# Patient Record
Sex: Male | Born: 1960
Health system: Southern US, Community
[De-identification: ages and names within clinical notes are randomized; demographics above are authoritative.]

## PROBLEM LIST (undated history)

## (undated) DIAGNOSIS — I499 Cardiac arrhythmia, unspecified: Secondary | ICD-10-CM

## (undated) DIAGNOSIS — M1990 Unspecified osteoarthritis, unspecified site: Secondary | ICD-10-CM

## (undated) DIAGNOSIS — IMO0001 Reserved for inherently not codable concepts without codable children: Secondary | ICD-10-CM

## (undated) DIAGNOSIS — Z87442 Personal history of urinary calculi: Secondary | ICD-10-CM

## (undated) DIAGNOSIS — K219 Gastro-esophageal reflux disease without esophagitis: Secondary | ICD-10-CM

## (undated) DIAGNOSIS — N200 Calculus of kidney: Secondary | ICD-10-CM

## (undated) DIAGNOSIS — C61 Malignant neoplasm of prostate: Secondary | ICD-10-CM

## (undated) HISTORY — PX: KIDNEY STONE SURGERY: SHX686

## (undated) HISTORY — PX: CERVICAL SPINE SURGERY: SHX589

## (undated) HISTORY — PX: OTHER SURGICAL HISTORY: SHX169

---

## 2005-12-15 ENCOUNTER — Emergency Department (HOSPITAL_COMMUNITY): Admission: EM | Admit: 2005-12-15 | Discharge: 2005-12-15 | Payer: Self-pay | Admitting: Emergency Medicine

## 2006-02-12 ENCOUNTER — Inpatient Hospital Stay (HOSPITAL_COMMUNITY): Admission: RE | Admit: 2006-02-12 | Discharge: 2006-02-13 | Payer: Self-pay | Admitting: Neurosurgery

## 2009-01-26 ENCOUNTER — Emergency Department (HOSPITAL_COMMUNITY): Admission: EM | Admit: 2009-01-26 | Discharge: 2009-01-26 | Payer: Self-pay | Admitting: Emergency Medicine

## 2009-01-27 ENCOUNTER — Inpatient Hospital Stay (HOSPITAL_COMMUNITY): Admission: EM | Admit: 2009-01-27 | Discharge: 2009-01-28 | Payer: Self-pay | Admitting: Emergency Medicine

## 2009-04-19 ENCOUNTER — Ambulatory Visit (HOSPITAL_COMMUNITY): Admission: RE | Admit: 2009-04-19 | Discharge: 2009-04-19 | Payer: Self-pay | Admitting: Urology

## 2009-05-10 ENCOUNTER — Emergency Department (HOSPITAL_COMMUNITY): Admission: EM | Admit: 2009-05-10 | Discharge: 2009-05-10 | Payer: Self-pay | Admitting: Emergency Medicine

## 2009-07-08 ENCOUNTER — Ambulatory Visit (HOSPITAL_COMMUNITY)
Admission: RE | Admit: 2009-07-08 | Discharge: 2009-07-08 | Payer: Self-pay | Source: Home / Self Care | Admitting: General Surgery

## 2010-05-10 ENCOUNTER — Encounter
Admission: RE | Admit: 2010-05-10 | Discharge: 2010-05-10 | Payer: Self-pay | Source: Home / Self Care | Attending: Internal Medicine | Admitting: Internal Medicine

## 2010-05-10 ENCOUNTER — Ambulatory Visit (HOSPITAL_COMMUNITY): Admission: RE | Admit: 2010-05-10 | Payer: Self-pay | Source: Home / Self Care | Admitting: Family Medicine

## 2010-06-11 ENCOUNTER — Encounter: Payer: Self-pay | Admitting: Family Medicine

## 2010-06-12 ENCOUNTER — Encounter: Payer: Self-pay | Admitting: Urology

## 2010-08-09 LAB — CBC
HCT: 40.8 % (ref 39.0–52.0)
Hemoglobin: 14 g/dL (ref 13.0–17.0)
MCHC: 34.3 g/dL (ref 30.0–36.0)
Platelets: 217 10*3/uL (ref 150–400)
RDW: 12.9 % (ref 11.5–15.5)
WBC: 8.5 10*3/uL (ref 4.0–10.5)

## 2010-08-09 LAB — BASIC METABOLIC PANEL
Calcium: 9.5 mg/dL (ref 8.4–10.5)
Creatinine, Ser: 0.95 mg/dL (ref 0.4–1.5)
GFR calc non Af Amer: >60 mL/min (ref >60)
Glucose, Bld: 86 mg/dL (ref 70–99)

## 2010-08-21 LAB — DIFFERENTIAL
Basophils Absolute: 0 10*3/uL (ref 0.0–0.1)
Lymphs Abs: 0.7 10*3/uL (ref 0.7–4.0)
Monocytes Absolute: 0.6 10*3/uL (ref 0.1–1.0)
Monocytes Relative: 5 % (ref 3–12)
Neutro Abs: 12.7 10*3/uL — ABNORMAL HIGH (ref 1.7–7.7)
Neutrophils Relative %: 91 % — ABNORMAL HIGH (ref 43–77)

## 2010-08-21 LAB — CBC
MCV: 91.4 fL (ref 78.0–100.0)
RBC: 4.44 MIL/uL (ref 4.22–5.81)
RDW: 13.5 % (ref 11.5–15.5)
WBC: 14 10*3/uL — ABNORMAL HIGH (ref 4.0–10.5)

## 2010-08-21 LAB — BASIC METABOLIC PANEL
BUN: 11 mg/dL (ref 6–23)
CO2: 29 mEq/L (ref 19–32)
Calcium: 9.4 mg/dL (ref 8.4–10.5)
Creatinine, Ser: 1.06 mg/dL (ref 0.4–1.5)
GFR calc non Af Amer: >60 mL/min (ref >60)

## 2010-08-21 LAB — URINALYSIS, ROUTINE W REFLEX MICROSCOPIC
Leukocytes, UA: NEGATIVE
Nitrite: NEGATIVE
Urobilinogen, UA: 0.2 mg/dL (ref 0.0–1.0)

## 2010-08-21 LAB — URINE MICROSCOPIC-ADD ON

## 2010-08-25 LAB — URINALYSIS, ROUTINE W REFLEX MICROSCOPIC
Protein, ur: NEGATIVE mg/dL
Specific Gravity, Urine: 1.02 (ref 1.005–1.030)
Urobilinogen, UA: 0.2 mg/dL (ref 0.0–1.0)
pH: 6 (ref 5.0–8.0)

## 2010-08-25 LAB — URINE CULTURE
Colony Count: NO GROWTH
Culture: NO GROWTH
Special Requests: NEGATIVE

## 2010-08-25 LAB — POCT I-STAT 4, (NA,K, GLUC, HGB,HCT)
Glucose, Bld: 121 mg/dL — ABNORMAL HIGH (ref 70–99)
HCT: 46 % (ref 39.0–52.0)

## 2010-10-06 NOTE — Op Note (Signed)
NAMETYRECE, VANTERPOOL NO.:  [id number]   MEDICAL RECORD NO.:  [id number]          PATIENT TYPE:  INP   LOCATION:  3014                         FACILITY:  [facility]   PHYSICIAN:  Hilda Lias, M.D.   DATE OF BIRTH:  10/16/1960   DATE OF PROCEDURE:  02/11/2006  DATE OF DISCHARGE:                                 OPERATIVE REPORT   PREOPERATIVE DIAGNOSES:  C5-C6, C6-C7 herniated disk with radiculopathy.  Displacement of spinal cord.   POSTOPERATIVE DIAGNOSES:  C5-C6, C6-C7 herniated disk with radiculopathy.  Displacement of spinal cord.   PROCEDURE:  Anterior C5-6, C6-7 decompression of the spinal cord.  Total  diskectomy.  Interbody fusion with allograft plate.  Microscope.   SURGEON:  Hilda Lias, M.D.   ASSISTANT:  Dr. Lovell Sheehan.   CLINICAL HISTORY:  The patient was seen in my office late last week because  of neck and left upper extremity pain.  Clinically I found weakness of the  biceps and the triceps.  He has a large herniated disk with displacement of  the spinal cord.  Surgery was advised, and the risks were explained in the  history and physical.   PROCEDURE:  The patient was taken to the OR, underwent intubation.  The neck  was prepped with DuraPrep.  A transverse incision was made into the skin,  subcutaneous tissue, platysma, down to the cervical spine.  X-ray showed  that we were at the level of 5-6.  From then on, anterior osteophytes were  removed.  We opened the anterior ligament and we brought the microscope in.  A large amount at the end of the disk was removed.  At the level of 5-6, we  found a herniated disk plus mostly spondylosis bilaterally, left worse than  right.  The posterior ligament was opened and total diskectomy with  decompression of the C6 nerve root and the spinal cord was achieved.  At the  level of C6-C7 we found the same finding except that in the left side there  were 2 large free fragments.  Having done this, the area was  irrigated.  The  end plates were drilled.  Then two pieces of allograft, one of 7 between 5-  6, another of 8 mm between 6 and 7 were introduced with autograft inside.  This was followed by a plate using 6 screws.  The lateral C-spine showed  good position of the graft and the plates.  From then on, the area was  irrigated.  Although we have hemostasis, we have some oozing during the  procedure and we left a Jackson-Pratt in the upper cervical area.  From then  on, the area was irrigated, closed with Vicryl and Steri-Strips.           ______________________________  Hilda Lias, M.D.     EB/MEDQ  D:  02/11/2006  T:  02/12/2006  Job:  [id number]

## 2010-10-30 ENCOUNTER — Other Ambulatory Visit: Payer: Self-pay | Admitting: Internal Medicine

## 2010-10-30 DIAGNOSIS — R911 Solitary pulmonary nodule: Secondary | ICD-10-CM

## 2010-10-31 ENCOUNTER — Other Ambulatory Visit: Payer: Self-pay

## 2010-10-31 ENCOUNTER — Ambulatory Visit
Admission: RE | Admit: 2010-10-31 | Discharge: 2010-10-31 | Disposition: A | Discharge status: Home / Self Care | Payer: BC Managed Care – PPO | Source: Ambulatory Visit | Attending: Internal Medicine | Admitting: Internal Medicine

## 2010-10-31 DIAGNOSIS — R911 Solitary pulmonary nodule: Secondary | ICD-10-CM

## 2010-10-31 MED ORDER — IOHEXOL 300 MG/ML  SOLN
75.0000 mL | Freq: Once | INTRAMUSCULAR | Status: AC | PRN
  Administered 2010-10-31: 75 mL via INTRAVENOUS

## 2011-08-17 ENCOUNTER — Encounter (HOSPITAL_COMMUNITY): Payer: Self-pay | Admitting: Emergency Medicine

## 2011-08-17 ENCOUNTER — Emergency Department (HOSPITAL_COMMUNITY)
Admission: EM | Admit: 2011-08-17 | Discharge: 2011-08-17 | Disposition: A | Discharge status: Home / Self Care | Payer: No Typology Code available for payment source | Attending: Emergency Medicine | Admitting: Emergency Medicine

## 2011-08-17 DIAGNOSIS — T148XXA Other injury of unspecified body region, initial encounter: Secondary | ICD-10-CM

## 2011-08-17 DIAGNOSIS — S40019A Contusion of unspecified shoulder, initial encounter: Secondary | ICD-10-CM | POA: Insufficient documentation

## 2011-08-17 DIAGNOSIS — IMO0001 Reserved for inherently not codable concepts without codable children: Secondary | ICD-10-CM | POA: Insufficient documentation

## 2011-08-17 DIAGNOSIS — M25519 Pain in unspecified shoulder: Secondary | ICD-10-CM | POA: Insufficient documentation

## 2011-08-17 HISTORY — DX: Reserved for inherently not codable concepts without codable children: IMO0001
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
## 2011-08-18 NOTE — ED Provider Notes (Signed)
Medical screening examination/treatment/procedure(s) were conducted as a shared visit with non-physician practitioner(s) and myself.  I personally evaluated the patient during the encounter  Doug Sou, MD 08/18/11 [phone]

## 2013-11-10 ENCOUNTER — Other Ambulatory Visit: Payer: Self-pay | Admitting: Internal Medicine

## 2013-11-10 DIAGNOSIS — M542 Cervicalgia: Secondary | ICD-10-CM

## 2013-11-11 ENCOUNTER — Ambulatory Visit
Admission: RE | Admit: 2013-11-11 | Discharge: 2013-11-11 | Disposition: A | Discharge status: Home / Self Care | Payer: BC Managed Care – PPO | Source: Ambulatory Visit | Attending: Internal Medicine | Admitting: Internal Medicine

## 2013-11-11 DIAGNOSIS — M542 Cervicalgia: Secondary | ICD-10-CM

## 2013-11-11 MED ORDER — IOHEXOL 300 MG/ML  SOLN
75.0000 mL | Freq: Once | INTRAMUSCULAR | Status: AC | PRN
  Administered 2013-11-11: 75 mL via INTRAVENOUS

## 2013-12-04 ENCOUNTER — Other Ambulatory Visit: Payer: Self-pay | Admitting: Neurosurgery

## 2013-12-04 DIAGNOSIS — R1310 Dysphagia, unspecified: Secondary | ICD-10-CM

## 2013-12-07 ENCOUNTER — Other Ambulatory Visit: Payer: BC Managed Care – PPO

## 2013-12-08 ENCOUNTER — Ambulatory Visit
Admission: RE | Admit: 2013-12-08 | Discharge: 2013-12-08 | Disposition: A | Discharge status: Home / Self Care | Payer: BC Managed Care – PPO | Source: Ambulatory Visit | Attending: Neurosurgery | Admitting: Neurosurgery

## 2013-12-08 DIAGNOSIS — R1310 Dysphagia, unspecified: Secondary | ICD-10-CM

## 2014-06-14 ENCOUNTER — Encounter: Payer: Self-pay | Admitting: Cardiology

## 2014-06-14 ENCOUNTER — Ambulatory Visit (INDEPENDENT_AMBULATORY_CARE_PROVIDER_SITE_OTHER): Payer: 59 | Admitting: Cardiology

## 2014-06-14 VITALS — BP 138/74 | HR 104 | Ht 72.0 in | Wt 195.0 lb

## 2014-06-14 DIAGNOSIS — R000 Tachycardia, unspecified: Secondary | ICD-10-CM | POA: Insufficient documentation

## 2014-06-14 DIAGNOSIS — R002 Palpitations: Secondary | ICD-10-CM | POA: Insufficient documentation

## 2014-06-14 DIAGNOSIS — I493 Ventricular premature depolarization: Secondary | ICD-10-CM | POA: Insufficient documentation

## 2014-06-14 MED ORDER — METOPROLOL SUCCINATE ER 25 MG PO TB24: 25.0000 mg | ORAL_TABLET | Freq: Every day | ORAL | Status: DC

## 2014-06-14 NOTE — Progress Notes (Signed)
      [place]. [address], [address] [place], [place]  [address] Phone: [phone] Fax:  [phone]  Date:  06/14/2014   ID:  Stanley Sharp, DOB 04/21/1961, MRN [id number]  PCP:  No primary care provider on file.   History of Present Illness: Stanley Sharp is a 54 y.o. male here for the evaluation of palpitations/tachycardia/PVCs. He was worked up previously in 2012 with normal echocardiogram. PVC's noted in workup.  Metoprolol worked well. May have tried diltiazem but this was changed. After being on metoprolol for approximately one year, did not want to take the medicine any further and we decided to taper off the medication. He did very well with this and has not had any palpitations up until very recently. 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    No ETOH, non smoker. No sudafed. No caffeine. He admits that he does not like to take medications.  His wife works at [employer].   Wt Readings from Last 3 Encounters:  06/14/14 195 lb (88.451 kg)     Past Medical History  Diagnosis Date  . No significant past medical history     Past Surgical History  Procedure Laterality Date  . Cervical spine surgery      No current outpatient prescriptions on file.   No current facility-administered medications for this visit.    Allergies:   No Known Allergies  Social History:  The patient  reports that he has never smoked. He does not have any smokeless tobacco history on file. He reports that he does not drink alcohol or use illicit drugs.   Family History: Grandfather had 3 MI's. Mother has DM.   ROS:  Please see the history of present illness.   No syncope, no bleeding, no chest pain, no orthopnea, no PND. Mildly anxious surrounding elevated heart rate.   All other systems reviewed and negative.   PHYSICAL EXAM: VS:  BP 138/74 mmHg  Pulse 104  Ht 6' (1.829 m)  Wt 195 lb (88.451 kg)  BMI 26.44 kg/m2 Well nourished, well developed, in no acute distress HEENT: normal,  [place]/AT, EOMI Neck: no JVD, normal carotid upstroke, no bruit. Left neck scar anterior noted. Prior cervical spine surgery. Cardiac:  normal S1, S2; Tachy mild with occasional ectopy; no murmur Lungs:  clear to auscultation bilaterally, no wheezing, rhonchi or rales Abd: soft, nontender, no hepatomegaly, no bruits Ext: no edema, 2+ distal pulses Skin: warm and dry GU: deferred Neuro: no focal abnormalities noted, AAO x 3  EKG:  06/14/14-sinus tachycardia with 2 PVCs noted. Nonspecific ST-T wave changes. Heart rate 104.    Echocardiogram: 2012-unremarkable. Normal ejection fraction.  ASSESSMENT AND PLAN:  1. Tachycardia/PVC-previously, responded well to metoprolol. We will go ahead and start metoprolol extended release 25 mg once a day. If after 5-7 days, no change in symptoms, we will likely increase the dosage to 50 mg. If cost is an issue, we will change to metoprolol tartrate. Previous workup was unremarkable. I will check TSH, basic metabolic profile. Check electrolytes. I do not appreciate any new murmurs. No heart failure symptoms. 2. We will see back in 2 months.  Signed, Candee Furbish, MD [facility]  06/14/2014 4:13 PM

## 2014-06-14 NOTE — Patient Instructions (Signed)
Please start Metoprolol succinate 25 mg once a day. Continue all other medications as listed.  Please have blood work today (BMP, TSH)  Follow up in 2 months with Dr Marlou Porch.  Thank you for choosing [facility]!!

## 2014-06-15 LAB — BASIC METABOLIC PANEL
BUN: 18 mg/dL (ref 6–23)
CALCIUM: 9.5 mg/dL (ref 8.4–10.5)
CHLORIDE: 106 meq/L (ref 96–112)
CO2: 25 meq/L (ref 19–32)
Creatinine, Ser: 0.9 mg/dL (ref 0.40–1.50)
GFR: 93 mL/min (ref >60.00)
Glucose, Bld: 92 mg/dL (ref 70–99)
Potassium: 3.8 mEq/L (ref 3.5–5.1)
SODIUM: 139 meq/L (ref 135–145)

## 2014-06-15 LAB — TSH: TSH: 1.51 u[IU]/mL (ref 0.35–4.50)

## 2014-08-18 ENCOUNTER — Ambulatory Visit (INDEPENDENT_AMBULATORY_CARE_PROVIDER_SITE_OTHER): Payer: 59 | Admitting: Cardiology

## 2014-08-18 ENCOUNTER — Encounter: Payer: Self-pay | Admitting: Cardiology

## 2014-08-18 VITALS — BP 122/72 | HR 64 | Ht 72.0 in | Wt 194.0 lb

## 2014-08-18 DIAGNOSIS — I493 Ventricular premature depolarization: Secondary | ICD-10-CM | POA: Diagnosis not present

## 2014-08-18 DIAGNOSIS — R002 Palpitations: Secondary | ICD-10-CM | POA: Diagnosis not present

## 2014-08-18 DIAGNOSIS — R000 Tachycardia, unspecified: Secondary | ICD-10-CM

## 2014-08-18 MED ORDER — METOPROLOL SUCCINATE ER 25 MG PO TB24: 25.0000 mg | ORAL_TABLET | Freq: Every day | ORAL | Status: DC

## 2014-08-18 NOTE — Patient Instructions (Signed)
Your physician recommends that you continue on your current medications as directed. Please refer to the Current Medication list given to you today.  Your physician wants you to follow-up in: 6 months with Dr. Skains. You will receive a reminder letter in the mail two months in advance. If you don't receive a letter, please call our office to schedule the follow-up appointment.  

## 2014-08-18 NOTE — Progress Notes (Signed)
      [place]. [address] [place], [place]  [address] Phone: [phone] Fax:  [phone]  Date:  08/18/2014   ID:  RHYATT Sharp, DOB [birth year], MRN [id number]  PCP:  Glo Herring., MD   History of Present Illness: Stanley Sharp is a 54 y.o. male here for thefollow up of palpitations/tachycardia/PVCs. He was given Toprol 25 mg and felt better the next day. His heart rate previously was 104, now it is in the 60s. He had an excellent result. Thyroid function, lab work unremarkable.  He was worked up previously in 2012 with normal echocardiogram. PVC's noted in workup.  Metoprolol worked well. May have tried diltiazem but this was changed. After being on metoprolol for approximately one year, did not want to take the medicine any further and we decided to taper off the medication. He did very well with this and has not had any palpitations up until very recently. 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    No ETOH, non smoker. No sudafed. No caffeine. He admits that he does not like to take medications.  His wife works at [employer], [place].    Wt Readings from Last 3 Encounters:  08/18/14 194 lb (87.998 kg)  06/14/14 195 lb (88.451 kg)     Past Medical History  Diagnosis Date  . No significant past medical history     Past Surgical History  Procedure Laterality Date  . Cervical spine surgery      Current Outpatient Prescriptions  Medication Sig Dispense Refill  . metoprolol succinate (TOPROL-XL) 25 MG 24 hr tablet Take 1 tablet (25 mg total) by mouth daily. 30 tablet 6   No current facility-administered medications for this visit.    Allergies:   No Known Allergies  Social History:  The patient  reports that he has never smoked. He does not have any smokeless tobacco history on file. He reports that he does not drink alcohol or use illicit drugs.   Family History: Grandfather had 3 MI's. Mother has DM.   ROS:  Please see the history of  present illness.   No syncope, no bleeding, no chest pain, no orthopnea, no PND. Mildly anxious surrounding elevated heart rate.   All other systems reviewed and negative.   PHYSICAL EXAM: VS:  BP 122/72 mmHg  Pulse 64  Ht 6' (1.829 m)  Wt 194 lb (87.998 kg)  BMI 26.31 kg/m2 Well nourished, well developed, in no acute distress HEENT: normal, [place]/AT, EOMI Neck: no JVD, normal carotid upstroke, no bruit. Left neck scar anterior noted. Prior cervical spine surgery. Cardiac:  normal S1, S2;  rate normal with occasional ectopy; no murmur Lungs:  clear to auscultation bilaterally, no wheezing, rhonchi or rales Abd: soft, nontender, no hepatomegaly, no bruits Ext: no edema, 2+ distal pulses Skin: warm and dry GU: deferred Neuro: no focal abnormalities noted, AAO x 3  EKG:  06/14/14-sinus tachycardia with 2 PVCs noted. Nonspecific ST-T wave changes. Heart rate 104.    Echocardiogram: 2012-unremarkable. Normal ejection fraction.  ASSESSMENT AND PLAN:  1. Tachycardia/PVC- responded well to metoprolol succinate 25 mg. Previous workup was unremarkable. TSH, basic metabolic profile was normal. I do not appreciate any new murmurs. No heart failure symptoms. 2. We will see back in 6 months.  Signed, Candee Furbish, MD [facility]  08/18/2014 3:55 PM

## 2015-02-16 ENCOUNTER — Ambulatory Visit (INDEPENDENT_AMBULATORY_CARE_PROVIDER_SITE_OTHER): Payer: 59 | Admitting: Cardiology

## 2015-02-16 ENCOUNTER — Encounter: Payer: Self-pay | Admitting: Cardiology

## 2015-02-16 VITALS — BP 122/60 | HR 83 | Ht 72.0 in | Wt 191.1 lb

## 2015-02-16 DIAGNOSIS — R000 Tachycardia, unspecified: Secondary | ICD-10-CM | POA: Diagnosis not present

## 2015-02-16 DIAGNOSIS — I493 Ventricular premature depolarization: Secondary | ICD-10-CM | POA: Diagnosis not present

## 2015-02-16 DIAGNOSIS — R002 Palpitations: Secondary | ICD-10-CM | POA: Diagnosis not present

## 2015-02-16 NOTE — Patient Instructions (Signed)
Medication Instructions:  The current medical regimen is effective;  continue present plan and medications.  Follow-Up: Follow up in 1 year with Dr. Skains.  You will receive a letter in the mail 2 months before you are due.  Please call us when you receive this letter to schedule your follow up appointment.  Thank you for choosing [facility]!!     

## 2015-02-16 NOTE — Progress Notes (Signed)
[place]. [address] [place], [place]  [address] Phone: [phone] Fax:  [phone]  Date:  02/16/2015   ID:  Stanley Sharp, DOB Oct 27, 1960, MRN [id number]  PCP:  Glo Herring., MD   History of Present Illness: Stanley Sharp is a 54 y.o. male here for thefollow up of palpitations/tachycardia/PVCs. He was given Toprol 25 mg and felt better the next day. His heart rate previously was 104, now it is in the 60s. He had an excellent result. Thyroid function, lab work unremarkable.  He was worked up previously in 2012 with normal echocardiogram. PVC's noted in workup.  Metoprolol worked well. May have tried diltiazem but this was changed. After being on metoprolol for approximately one year, did not want to take the medicine any further and we decided to taper off the medication. He did very well with this and has not had any palpitations up until very recently. 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    No ETOH, non smoker. No sudafed. No caffeine. He admits that he does not like to take medications.  His wife works at [employer], [place].   02/16/15-doing well, no symptoms. Taking Toprol at night. He is not having any further tachycardia experiences. No palpitations. He is quite active at work.   Wt Readings from Last 3 Encounters:  02/16/15 191 lb 1.9 oz (86.691 kg)  08/18/14 194 lb (87.998 kg)  06/14/14 195 lb (88.451 kg)     Past Medical History  Diagnosis Date  . No significant past medical history     Past Surgical History  Procedure Laterality Date  . Cervical spine surgery      Current Outpatient Prescriptions  Medication Sig Dispense Refill  . metoprolol succinate (TOPROL-XL) 25 MG 24 hr tablet Take 1 tablet (25 mg total) by mouth daily. 90 tablet 3   No current facility-administered medications for this visit.    Allergies:   No Known Allergies  Social History:  The patient  reports that he has never smoked. He does not have any  smokeless tobacco history on file. He reports that he does not drink alcohol or use illicit drugs.   Family History: Grandfather had 3 MI's. Mother has DM.   ROS:  Please see the history of present illness.   No syncope, no bleeding, no chest pain, no orthopnea, no PND. Mildly anxious surrounding elevated heart rate.   All other systems reviewed and negative.   PHYSICAL EXAM: VS:  BP 122/60 mmHg  Pulse 83  Ht 6' (1.829 m)  Wt 191 lb 1.9 oz (86.691 kg)  BMI 25.91 kg/m2 Well nourished, well developed, in no acute distress HEENT: normal, /AT, EOMI Neck: no JVD, normal carotid upstroke, no bruit. Left neck scar anterior noted. Prior cervical spine surgery. Cardiac:  normal S1, S2;  rate normal with occasional ectopy; no murmur Lungs:  clear to auscultation bilaterally, no wheezing, rhonchi or rales Abd: soft, nontender, no hepatomegaly, no bruits Ext: no edema, 2+ distal pulses Skin: warm and dry GU: deferred Neuro: no focal abnormalities noted, AAO x 3  EKG:  Prior 06/14/14-sinus tachycardia with 2 PVCs noted. Nonspecific ST-T wave changes. Heart rate 104.    Echocardiogram: 2012-unremarkable. Normal ejection fraction.  ASSESSMENT AND PLAN:  1. Tachycardia/PVC- responded well to metoprolol succinate 25 mg. Previous workup was unremarkable. TSH, basic metabolic profile was normal. I do not appreciate any new murmurs. No heart failure symptoms. We will continue  with this medication. He is taking this at night. We discussed the possibility of discontinuing but he would like to go ahead and proceed with current treatment course. 2. We will see back in 12 months.  Signed, Candee Furbish, MD [facility]  02/16/2015 3:43 PM

## 2015-06-07 MED FILL — METOPROLOL SUCC ER 25 MG TA: 25 | 90 days supply | Qty: 90 | Fill #3

## 2015-06-17 DIAGNOSIS — Z0000 Encounter for general adult medical examination without abnormal findings: Secondary | ICD-10-CM | POA: Diagnosis not present

## 2015-06-17 DIAGNOSIS — R972 Elevated prostate specific antigen [PSA]: Secondary | ICD-10-CM | POA: Diagnosis not present

## 2015-06-22 MED FILL — levoFLOXacin 500 MG TABS: 500 | 3 days supply | Qty: 3 | Fill #0

## 2015-06-24 DIAGNOSIS — R972 Elevated prostate specific antigen [PSA]: Secondary | ICD-10-CM | POA: Diagnosis not present

## 2015-07-12 DIAGNOSIS — N390 Urinary tract infection, site not specified: Secondary | ICD-10-CM | POA: Diagnosis not present

## 2015-07-12 DIAGNOSIS — Z0000 Encounter for general adult medical examination without abnormal findings: Secondary | ICD-10-CM | POA: Diagnosis not present

## 2015-07-12 DIAGNOSIS — C61 Malignant neoplasm of prostate: Secondary | ICD-10-CM | POA: Diagnosis not present

## 2015-07-13 DIAGNOSIS — N400 Enlarged prostate without lower urinary tract symptoms: Secondary | ICD-10-CM | POA: Diagnosis not present

## 2015-07-13 DIAGNOSIS — Z1389 Encounter for screening for other disorder: Secondary | ICD-10-CM | POA: Diagnosis not present

## 2015-07-13 DIAGNOSIS — Z6826 Body mass index (BMI) 26.0-26.9, adult: Secondary | ICD-10-CM | POA: Diagnosis not present

## 2015-07-13 DIAGNOSIS — E663 Overweight: Secondary | ICD-10-CM | POA: Diagnosis not present

## 2015-07-13 DIAGNOSIS — J111 Influenza due to unidentified influenza virus with other respiratory manifestations: Secondary | ICD-10-CM | POA: Diagnosis not present

## 2015-07-13 MED FILL — AZITHROMYCIN 250 MG TABLET: 250 | 5 days supply | Qty: 6 | Fill #0

## 2015-07-13 MED FILL — OSELTAMIVIR PHOS 75 MG CAP: 75 | 5 days supply | Qty: 10 | Fill #0

## 2015-08-22 DIAGNOSIS — Z1283 Encounter for screening for malignant neoplasm of skin: Secondary | ICD-10-CM | POA: Diagnosis not present

## 2015-08-29 ENCOUNTER — Other Ambulatory Visit: Payer: Self-pay | Admitting: Cardiology

## 2015-08-30 ENCOUNTER — Other Ambulatory Visit: Payer: Self-pay | Admitting: *Deleted

## 2015-08-30 DIAGNOSIS — R002 Palpitations: Secondary | ICD-10-CM

## 2015-08-30 MED ORDER — METOPROLOL SUCCINATE ER 25 MG PO TB24: 25.0000 mg | ORAL_TABLET | Freq: Every day | ORAL | Status: DC

## 2015-08-30 MED FILL — METOPROLOL SUCC ER 25 MG TA: 25 | 90 days supply | Qty: 90 | Fill #0

## 2015-11-08 ENCOUNTER — Other Ambulatory Visit (HOSPITAL_COMMUNITY): Payer: Self-pay | Admitting: Urology

## 2015-11-08 DIAGNOSIS — C61 Malignant neoplasm of prostate: Secondary | ICD-10-CM

## 2015-12-05 MED FILL — METOPROLOL SUCC ER 25 MG TA: 25 | 90 days supply | Qty: 90 | Fill #1

## 2015-12-22 MED FILL — diazePAM 10 MG TABS: 10 | 1 days supply | Qty: 1 | Fill #0

## 2015-12-30 ENCOUNTER — Ambulatory Visit (HOSPITAL_COMMUNITY)
Admission: RE | Admit: 2015-12-30 | Discharge: 2015-12-30 | Disposition: A | Discharge status: Home / Self Care | Payer: 59 | Source: Ambulatory Visit | Attending: Urology | Admitting: Urology

## 2015-12-30 DIAGNOSIS — C61 Malignant neoplasm of prostate: Secondary | ICD-10-CM | POA: Insufficient documentation

## 2015-12-30 DIAGNOSIS — R938 Abnormal findings on diagnostic imaging of other specified body structures: Secondary | ICD-10-CM | POA: Diagnosis not present

## 2015-12-30 DIAGNOSIS — N429 Disorder of prostate, unspecified: Secondary | ICD-10-CM | POA: Diagnosis not present

## 2015-12-30 MED ORDER — GADOBENATE DIMEGLUMINE 529 MG/ML IV SOLN
20.0000 mL | Freq: Once | INTRAVENOUS | Status: AC | PRN
  Administered 2015-12-30: 18 mL via INTRAVENOUS

## 2015-12-31 ENCOUNTER — Other Ambulatory Visit (HOSPITAL_COMMUNITY): Payer: 59

## 2016-01-04 DIAGNOSIS — L723 Sebaceous cyst: Secondary | ICD-10-CM | POA: Diagnosis not present

## 2016-01-04 DIAGNOSIS — D2239 Melanocytic nevi of other parts of face: Secondary | ICD-10-CM | POA: Diagnosis not present

## 2016-01-05 MED FILL — DOXYCYCLINE HYCLATE 100 MG: 100 | 14 days supply | Qty: 28 | Fill #0

## 2016-01-06 ENCOUNTER — Encounter: Payer: Self-pay | Admitting: Cardiology

## 2016-01-13 ENCOUNTER — Encounter: Payer: Self-pay | Admitting: Cardiology

## 2016-01-19 MED FILL — DOXYCYCLINE HYCLATE 100 MG: 100 | 14 days supply | Qty: 28 | Fill #1

## 2016-01-30 ENCOUNTER — Encounter: Payer: Self-pay | Admitting: Cardiology

## 2016-01-30 ENCOUNTER — Ambulatory Visit (INDEPENDENT_AMBULATORY_CARE_PROVIDER_SITE_OTHER): Payer: 59 | Admitting: Cardiology

## 2016-01-30 VITALS — BP 120/70 | HR 71 | Ht 72.0 in | Wt 193.0 lb

## 2016-01-30 DIAGNOSIS — R002 Palpitations: Secondary | ICD-10-CM

## 2016-01-30 DIAGNOSIS — I493 Ventricular premature depolarization: Secondary | ICD-10-CM | POA: Diagnosis not present

## 2016-01-30 DIAGNOSIS — C61 Malignant neoplasm of prostate: Secondary | ICD-10-CM

## 2016-01-30 DIAGNOSIS — R000 Tachycardia, unspecified: Secondary | ICD-10-CM | POA: Diagnosis not present

## 2016-01-30 NOTE — Progress Notes (Signed)
[place]. [address] [place], [place]  [address] Phone: [phone] Fax:  [phone]  Date:  01/30/2016   ID:  JALIK PHARO, DOB 1960/12/09, MRN [id number]  PCP:  Glo Herring., MD   History of Present Illness: LADISLAO Sharp is a 55 y.o. male here for follow up of palpitations/tachycardia/PVCs. He was given Toprol 25 mg and felt better the next day. His heart rate previously was 104, now it is in the 60s. He had an excellent result. Thyroid function, lab work unremarkable.  He was worked up previously in 2012 with normal echocardiogram. PVC's noted in workup.  Metoprolol worked well. May have tried diltiazem but this was changed. After being on metoprolol for approximately one year, did not want to take the medicine any further and we decided to taper off the medication. He did very well with this and has not had any palpitations up until very recently. 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    No ETOH, non smoker. No sudafed. No caffeine. He admits that he does not like to take medications.  His wife works at [employer], [place].   02/16/15-doing well, no symptoms. Taking Toprol at night. He is not having any further tachycardia experiences. No palpitations. He is quite active at work.  01/30/16-no heart issues. 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 Dr. Alinda Money. Biopsies. PSA was 4 range. He is currently trying to contemplate what his next move should be. No palpitations, no chest pain, no tachycardia.   Wt Readings from Last 3 Encounters:  01/30/16 193 lb (87.5 kg)  02/16/15 191 lb 1.9 oz (86.7 kg)  08/18/14 194 lb (88 kg)     Past Medical History:  Diagnosis Date  . No significant past medical history     Past Surgical History:  Procedure Laterality Date  . CERVICAL SPINE SURGERY      Current Outpatient Prescriptions  Medication Sig Dispense Refill  . metoprolol succinate (TOPROL-XL) 25 MG 24 hr tablet Take 1 tablet  (25 mg total) by mouth daily. 90 tablet 3   No current facility-administered medications for this visit.     Allergies:   No Known Allergies  Social History:  The Sharp  reports that he has never smoked. He has never used smokeless tobacco. He reports that he does not drink alcohol or use drugs.   Family History: Grandfather had 3 MI's. Mother has DM.   ROS:  Please see the history of present illness.   No syncope, no bleeding, no chest pain, no orthopnea, no PND. Mildly anxious surrounding elevated heart rate.   All other systems reviewed and negative.   PHYSICAL EXAM: VS:  BP 120/70   Pulse 71   Ht 6' (1.829 m)   Wt 193 lb (87.5 kg)   BMI 26.18 kg/m  Well nourished, well developed, in no acute distress HEENT: normal, [place]/AT, EOMI Neck: no JVD, normal carotid upstroke, no bruit. Left neck scar anterior noted. Prior cervical spine surgery. Cardiac:  normal S1, S2;  rate normal with occasional ectopy; no murmur Lungs:  clear to auscultation bilaterally, no wheezing, rhonchi or rales Abd: soft, nontender, no hepatomegaly, no bruits Ext: no edema, 2+ distal pulses Skin: warm and dry GU: deferred Neuro: no focal abnormalities noted, AAO x 3  EKG:  EKG was ordered today 01/30/16-sinus rhythm 71 with no other abnormalities. Prior 06/14/14-sinus tachycardia with 2 PVCs noted. Nonspecific ST-T wave changes. Heart rate  104.    Echocardiogram: 2012-unremarkable. Normal ejection fraction.  ASSESSMENT AND PLAN:  1. Tachycardia/PVC- responded well to metoprolol succinate 25 mg. Previous workup was unremarkable. TSH, basic metabolic profile was normal. I do not appreciate any new murmurs. No heart failure symptoms. We will continue with this medication. He is taking this at night. We discussed the possibility of discontinuing but he would like to go ahead and proceed with current treatment course. 2. Prostate cancer-low-grade, followed by Dr. Alinda Money. Try to contemplate whether he should  continue with surveillance versus surgery. Obviously he is stressed about this. 3. We will see back in 12 months.  Signed, Candee Furbish, MD [facility]  01/30/2016 4:09 PM

## 2016-01-30 NOTE — Patient Instructions (Signed)

## 2016-02-03 ENCOUNTER — Ambulatory Visit: Payer: 59 | Admitting: Cardiology

## 2016-02-17 ENCOUNTER — Ambulatory Visit: Payer: 59 | Admitting: Cardiology

## 2016-02-20 MED FILL — levoFLOXacin 500 MG TABS: 500 | 3 days supply | Qty: 3 | Fill #0

## 2016-02-23 DIAGNOSIS — C61 Malignant neoplasm of prostate: Secondary | ICD-10-CM | POA: Diagnosis not present

## 2016-02-29 DIAGNOSIS — M545 Low back pain: Secondary | ICD-10-CM | POA: Diagnosis not present

## 2016-02-29 MED FILL — traMADol HCL 50 MG TABS: 50 | 5 days supply | Qty: 20 | Fill #0

## 2016-02-29 MED FILL — NAPROXEN 500 MG TABLET: 500 | 10 days supply | Qty: 20 | Fill #0

## 2016-02-29 MED FILL — CYCLOBENZAPRINE 5 MG TABLET: 5 | 10 days supply | Qty: 30 | Fill #0

## 2016-03-11 MED FILL — METOPROLOL SUCC ER 25 MG TA: 25 | 90 days supply | Qty: 90 | Fill #2 | Status: TO

## 2016-06-07 MED FILL — METOPROLOL SUCC ER 25 MG TA: 25 | 90 days supply | Qty: 90 | Fill #0

## 2016-08-30 DIAGNOSIS — C61 Malignant neoplasm of prostate: Secondary | ICD-10-CM | POA: Diagnosis not present

## 2016-09-05 DIAGNOSIS — C61 Malignant neoplasm of prostate: Secondary | ICD-10-CM | POA: Diagnosis not present

## 2016-09-10 ENCOUNTER — Other Ambulatory Visit: Payer: Self-pay | Admitting: Cardiology

## 2016-09-10 DIAGNOSIS — R002 Palpitations: Secondary | ICD-10-CM

## 2016-09-11 MED FILL — METOPROLOL SUCC ER 25 MG TA: 25 | 90 days supply | Qty: 90 | Fill #0

## 2016-12-03 MED FILL — METOPROLOL SUCC ER 25 MG TA: 25 | 90 days supply | Qty: 90 | Fill #1

## 2017-02-19 ENCOUNTER — Ambulatory Visit (INDEPENDENT_AMBULATORY_CARE_PROVIDER_SITE_OTHER): Payer: 59 | Admitting: Cardiology

## 2017-02-19 DIAGNOSIS — C61 Malignant neoplasm of prostate: Secondary | ICD-10-CM | POA: Diagnosis not present

## 2017-02-19 DIAGNOSIS — R002 Palpitations: Secondary | ICD-10-CM

## 2017-02-19 MED ORDER — METOPROLOL SUCCINATE ER 25 MG PO TB24: 25.0000 mg | ORAL_TABLET | Freq: Every day | ORAL | 3 refills | Status: DC

## 2017-02-19 NOTE — Patient Instructions (Signed)

## 2017-02-19 NOTE — Progress Notes (Signed)
[facility]. [address], [place]  [address] Phone: [phone] Fax:  [phone]  Date:  02/20/2017   ID:  Stanley Sharp, DOB [birth year], MRN [id number]  PCP:  Redmond School, MD   History of Present Illness: Stanley Sharp is a 56 y.o. male here for follow up of palpitations/tachycardia/PVCs. He was given Toprol 25 mg and felt better the next day. His heart rate previously was 104, now it is in the 60s. He had an excellent result. Thyroid function, lab work unremarkable.  He was worked up previously in 2012 with normal echocardiogram. PVC's noted in workup.  Metoprolol worked well. May have tried diltiazem but this was changed. After being on metoprolol for approximately one year, did not want to take the medicine any further and we decided to taper off the medication. He did very well with this and has not had any palpitations up until very recently. 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    No ETOH, non smoker. No sudafed. No caffeine. He admits that he does not like to take medications.  His wife works at [employer], [place].   02/16/15-doing well, no symptoms. Taking Toprol at night. He is not having any further tachycardia experiences. No palpitations. He is quite active at work.  01/30/16-no heart issues. 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 Dr. Alinda Money. Biopsies. PSA was 4 range. He is currently trying to contemplate what his next move should be. No palpitations, no chest pain, no tachycardia.  02/19/17 - caffiene triggered an episode of palpiations, brief. Stopped went away. Mild.  Denies CP, syncope, bleeding.    Wt Readings from Last 3 Encounters:  01/30/16 193 lb (87.5 kg)  02/16/15 191 lb 1.9 oz (86.7 kg)  08/18/14 194 lb (88 kg)     Past Medical History:  Diagnosis Date  . No significant past medical history     Past Surgical History:  Procedure Laterality Date  . CERVICAL SPINE SURGERY      Current  Outpatient Prescriptions  Medication Sig Dispense Refill  . Ascorbic Acid (VITAMIN C) 100 MG tablet Take 100 mg by mouth 2 (two) times daily.    . cholecalciferol (VITAMIN D) 400 units TABS tablet Take 400 Units by mouth daily.    . metoprolol succinate (TOPROL-XL) 25 MG 24 hr tablet Take 1 tablet (25 mg total) by mouth daily. 90 tablet 3  . Multiple Vitamin (MULTIVITAMIN WITH MINERALS) TABS tablet Take 1 tablet by mouth daily.    . Multiple Vitamins-Minerals (ZINC PO) Take 1 tablet by mouth daily.    . saw palmetto 160 MG capsule Take 160 mg by mouth 3 (three) times daily.     No current facility-administered medications for this visit.     Allergies:   No Known Allergies  Social History:  The Sharp  reports that he has never smoked. He has never used smokeless tobacco. He reports that he does not drink alcohol or use drugs.   Family History: Grandfather had 3 MI's. Mother has DM.   ROS:  Please see the history of present illness.   No syncope, no bleeding, no chest pain, no orthopnea, no PND. Mildly anxious surrounding elevated heart rate.   All other systems reviewed and negative.   PHYSICAL EXAM: VS:  There were no vitals taken for this visit. Please see intake sheet.  Well nourished, well developed, in no acute distress  HEENT: normal, [place]/AT, EOMI  Neck: no JVD, normal carotid upstroke, no bruit. Left neck scar anterior noted. Prior cervical spine surgery. Cardiac:  normal S1, S2;  rate normal with occasional ectopy; no murmur  Lungs:  clear to auscultation bilaterally, no wheezing, rhonchi or rales  Abd: soft, nontender, no hepatomegaly, no bruits  Ext: no edema, 2+ distal pulses Skin: warm and dry  GU: deferred Neuro: no focal abnormalities noted, AAO x 3  EKG:  EKG was ordered today 02/19/17-sinus rhythm 70 43M a no other abnormalities personally viewed-prior 01/30/16-sinus rhythm 71 with no other abnormalities. Prior 06/14/14-sinus tachycardia with 2 PVCs noted. Nonspecific  ST-T wave changes. Heart rate 104.    Echocardiogram: 2012-unremarkable. Normal ejection fraction.  ASSESSMENT AND PLAN:  1. Tachycardia/PVC- he had another bout of tachycardia. Still seem to respond well to the metoprolol. No changes made. responded well to metoprolol succinate 25 mg. Previous workup was unremarkable. TSH, basic metabolic profile was normal. I do not appreciate any new murmurs. No heart failure symptoms. We will continue with this medication. He is taking this at night. We discussed the possibility of discontinuing but he would like to go ahead and proceed with current treatment course. 2. Prostate cancer-low-grade, followed by Dr. Alinda Money. Try to contemplate whether he should continue with surveillance versus surgery. Obviously he is stressed about this. No changes stable.  3. We will see back in 12 months.  Signed, Candee Furbish, MD [facility]  02/20/2017 6:27 AM

## 2017-03-04 MED FILL — METOPROLOL SUCC ER 25 MG TA: 25 | 90 days supply | Qty: 90 | Fill #0 | Status: TO

## 2017-03-05 DIAGNOSIS — Z23 Encounter for immunization: Secondary | ICD-10-CM | POA: Diagnosis not present

## 2017-04-15 DIAGNOSIS — C61 Malignant neoplasm of prostate: Secondary | ICD-10-CM | POA: Diagnosis not present

## 2017-04-19 DIAGNOSIS — C61 Malignant neoplasm of prostate: Secondary | ICD-10-CM | POA: Diagnosis not present

## 2017-06-03 ENCOUNTER — Other Ambulatory Visit: Payer: Self-pay | Admitting: Cardiology

## 2017-06-03 DIAGNOSIS — R002 Palpitations: Secondary | ICD-10-CM

## 2017-06-03 MED ORDER — METOPROLOL SUCCINATE ER 25 MG PO TB24: 25.0000 mg | ORAL_TABLET | Freq: Every day | ORAL | 2 refills | Status: DC

## 2017-08-22 DIAGNOSIS — R3121 Asymptomatic microscopic hematuria: Secondary | ICD-10-CM | POA: Diagnosis not present

## 2017-08-22 DIAGNOSIS — R1084 Generalized abdominal pain: Secondary | ICD-10-CM | POA: Diagnosis not present

## 2017-10-16 DIAGNOSIS — C61 Malignant neoplasm of prostate: Secondary | ICD-10-CM | POA: Diagnosis not present

## 2017-10-23 DIAGNOSIS — C61 Malignant neoplasm of prostate: Secondary | ICD-10-CM | POA: Diagnosis not present

## 2017-10-23 DIAGNOSIS — N201 Calculus of ureter: Secondary | ICD-10-CM | POA: Diagnosis not present

## 2017-11-26 DIAGNOSIS — E663 Overweight: Secondary | ICD-10-CM | POA: Diagnosis not present

## 2017-11-26 DIAGNOSIS — Z6825 Body mass index (BMI) 25.0-25.9, adult: Secondary | ICD-10-CM | POA: Diagnosis not present

## 2017-11-26 DIAGNOSIS — Z0000 Encounter for general adult medical examination without abnormal findings: Secondary | ICD-10-CM | POA: Diagnosis not present

## 2017-11-26 DIAGNOSIS — N400 Enlarged prostate without lower urinary tract symptoms: Secondary | ICD-10-CM | POA: Diagnosis not present

## 2017-11-26 DIAGNOSIS — Z1389 Encounter for screening for other disorder: Secondary | ICD-10-CM | POA: Diagnosis not present

## 2017-11-26 DIAGNOSIS — C61 Malignant neoplasm of prostate: Secondary | ICD-10-CM | POA: Diagnosis not present

## 2018-01-02 DIAGNOSIS — Z6826 Body mass index (BMI) 26.0-26.9, adult: Secondary | ICD-10-CM | POA: Diagnosis not present

## 2018-01-02 DIAGNOSIS — M545 Low back pain: Secondary | ICD-10-CM | POA: Diagnosis not present

## 2018-01-02 DIAGNOSIS — Z1389 Encounter for screening for other disorder: Secondary | ICD-10-CM | POA: Diagnosis not present

## 2018-01-02 DIAGNOSIS — M5410 Radiculopathy, site unspecified: Secondary | ICD-10-CM | POA: Diagnosis not present

## 2018-01-02 DIAGNOSIS — E663 Overweight: Secondary | ICD-10-CM | POA: Diagnosis not present

## 2018-01-06 DIAGNOSIS — Z6825 Body mass index (BMI) 25.0-25.9, adult: Secondary | ICD-10-CM | POA: Diagnosis not present

## 2018-01-06 DIAGNOSIS — R000 Tachycardia, unspecified: Secondary | ICD-10-CM | POA: Diagnosis not present

## 2018-01-06 DIAGNOSIS — N400 Enlarged prostate without lower urinary tract symptoms: Secondary | ICD-10-CM | POA: Diagnosis not present

## 2018-01-06 DIAGNOSIS — E663 Overweight: Secondary | ICD-10-CM | POA: Diagnosis not present

## 2018-01-06 DIAGNOSIS — G5702 Lesion of sciatic nerve, left lower limb: Secondary | ICD-10-CM | POA: Diagnosis not present

## 2018-01-06 DIAGNOSIS — Z1389 Encounter for screening for other disorder: Secondary | ICD-10-CM | POA: Diagnosis not present

## 2018-01-09 ENCOUNTER — Other Ambulatory Visit: Payer: Self-pay | Admitting: Internal Medicine

## 2018-01-09 DIAGNOSIS — M5417 Radiculopathy, lumbosacral region: Secondary | ICD-10-CM

## 2018-01-09 DIAGNOSIS — M5432 Sciatica, left side: Secondary | ICD-10-CM

## 2018-01-10 ENCOUNTER — Other Ambulatory Visit: Payer: Self-pay

## 2018-01-10 ENCOUNTER — Encounter (HOSPITAL_COMMUNITY): Payer: Self-pay

## 2018-01-10 ENCOUNTER — Inpatient Hospital Stay (HOSPITAL_COMMUNITY)
Admission: EM | Admit: 2018-01-10 | Discharge: 2018-01-13 | DRG: 552 | Disposition: A | Discharge status: Home / Self Care | Payer: BLUE CROSS/BLUE SHIELD | Attending: Internal Medicine | Admitting: Internal Medicine

## 2018-01-10 ENCOUNTER — Emergency Department (HOSPITAL_COMMUNITY): Payer: BLUE CROSS/BLUE SHIELD

## 2018-01-10 DIAGNOSIS — M5432 Sciatica, left side: Secondary | ICD-10-CM | POA: Diagnosis not present

## 2018-01-10 DIAGNOSIS — M5418 Radiculopathy, sacral and sacrococcygeal region: Secondary | ICD-10-CM

## 2018-01-10 DIAGNOSIS — R000 Tachycardia, unspecified: Secondary | ICD-10-CM | POA: Diagnosis present

## 2018-01-10 DIAGNOSIS — Z79899 Other long term (current) drug therapy: Secondary | ICD-10-CM | POA: Diagnosis not present

## 2018-01-10 DIAGNOSIS — I10 Essential (primary) hypertension: Secondary | ICD-10-CM | POA: Diagnosis not present

## 2018-01-10 DIAGNOSIS — M5116 Intervertebral disc disorders with radiculopathy, lumbar region: Secondary | ICD-10-CM | POA: Diagnosis not present

## 2018-01-10 DIAGNOSIS — C61 Malignant neoplasm of prostate: Secondary | ICD-10-CM | POA: Diagnosis present

## 2018-01-10 DIAGNOSIS — M5416 Radiculopathy, lumbar region: Secondary | ICD-10-CM

## 2018-01-10 DIAGNOSIS — M5127 Other intervertebral disc displacement, lumbosacral region: Secondary | ICD-10-CM | POA: Diagnosis not present

## 2018-01-10 DIAGNOSIS — R52 Pain, unspecified: Secondary | ICD-10-CM | POA: Diagnosis not present

## 2018-01-10 DIAGNOSIS — M5442 Lumbago with sciatica, left side: Secondary | ICD-10-CM | POA: Diagnosis not present

## 2018-01-10 DIAGNOSIS — M5126 Other intervertebral disc displacement, lumbar region: Secondary | ICD-10-CM | POA: Diagnosis not present

## 2018-01-10 DIAGNOSIS — R002 Palpitations: Secondary | ICD-10-CM | POA: Diagnosis present

## 2018-01-10 HISTORY — DX: Calculus of kidney: N20.0

## 2018-01-10 HISTORY — DX: Malignant neoplasm of prostate: C61

## 2018-01-10 LAB — CBC
HEMATOCRIT: 44.6 % (ref 39.0–52.0)
HEMATOCRIT: 44.7 % (ref 39.0–52.0)
Hemoglobin: 14.6 g/dL (ref 13.0–17.0)
Hemoglobin: 14.5 g/dL (ref 13.0–17.0)
MCH: 30.6 pg (ref 26.0–34.0)
MCH: 30.4 pg (ref 26.0–34.0)
MCHC: 32.7 g/dL (ref 30.0–36.0)
MCHC: 32.4 g/dL (ref 30.0–36.0)
MCV: 93.5 fL (ref 78.0–100.0)
MCV: 93.7 fL (ref 78.0–100.0)
PLATELETS: 236 10*3/uL (ref 150–400)
Platelets: 219 10*3/uL (ref 150–400)
RBC: 4.77 MIL/uL (ref 4.22–5.81)
RBC: 4.77 MIL/uL (ref 4.22–5.81)
RDW: 13 % (ref 11.5–15.5)
RDW: 13.1 % (ref 11.5–15.5)
WBC: 8.8 10*3/uL (ref 4.0–10.5)
WBC: 10.3 10*3/uL (ref 4.0–10.5)

## 2018-01-10 LAB — COMPREHENSIVE METABOLIC PANEL
ALT: 17 U/L (ref 0–44)
ANION GAP: 9 (ref 5–15)
AST: 20 U/L (ref 15–41)
Albumin: 3.7 g/dL (ref 3.5–5.0)
Alkaline Phosphatase: 64 U/L (ref 38–126)
BILIRUBIN TOTAL: 1 mg/dL (ref 0.3–1.2)
BUN: 17 mg/dL (ref 6–20)
CHLORIDE: 107 mmol/L (ref 98–111)
CO2: 25 mmol/L (ref 22–32)
Calcium: 8.9 mg/dL (ref 8.9–10.3)
Creatinine, Ser: 1.07 mg/dL (ref 0.61–1.24)
GFR calc Af Amer: >60 mL/min (ref >60)
Glucose, Bld: 118 mg/dL — ABNORMAL HIGH (ref 70–99)
POTASSIUM: 4.1 mmol/L (ref 3.5–5.1)
Sodium: 141 mmol/L (ref 135–145)
Total Protein: 6.8 g/dL (ref 6.5–8.1)

## 2018-01-10 LAB — PROTIME-INR
INR: 1.24
PROTHROMBIN TIME: 15.5 s — AB (ref 11.4–15.2)

## 2018-01-10 LAB — RAPID URINE DRUG SCREEN, HOSP PERFORMED
Amphetamines: NOT DETECTED
BARBITURATES: NOT DETECTED
BENZODIAZEPINES: NOT DETECTED
Cocaine: NOT DETECTED
Opiates: POSITIVE — AB
TETRAHYDROCANNABINOL: NOT DETECTED

## 2018-01-10 LAB — CREATININE, SERUM: CREATININE: 1.02 mg/dL (ref 0.61–1.24)

## 2018-01-10 MED ORDER — SODIUM CHLORIDE 0.9% FLUSH: 3.0000 mL | INTRAVENOUS | Status: DC | PRN

## 2018-01-10 MED ORDER — METOPROLOL SUCCINATE ER 25 MG PO TB24
25.0000 mg | ORAL_TABLET | Freq: Every evening | ORAL | Status: DC
  Administered 2018-01-10 – 2018-01-12 (×3): 25 mg via ORAL
  Filled 2018-01-10 (×3): qty 1

## 2018-01-10 MED ORDER — DEXAMETHASONE SODIUM PHOSPHATE 10 MG/ML IJ SOLN
10.0000 mg | Freq: Once | INTRAMUSCULAR | Status: AC
  Administered 2018-01-10: 10 mg via INTRAVENOUS
  Filled 2018-01-10: qty 1

## 2018-01-10 MED ORDER — KETOROLAC TROMETHAMINE 15 MG/ML IJ SOLN
15.0000 mg | Freq: Three times a day (TID) | INTRAMUSCULAR | Status: DC
  Administered 2018-01-10 – 2018-01-13 (×8): 15 mg via INTRAVENOUS
  Filled 2018-01-10 (×8): qty 1

## 2018-01-10 MED ORDER — HYDROMORPHONE HCL 1 MG/ML IJ SOLN
0.5000 mg | Freq: Once | INTRAMUSCULAR | Status: AC
  Administered 2018-01-10: 0.5 mg via INTRAVENOUS
  Filled 2018-01-10: qty 1

## 2018-01-10 MED ORDER — HYDROMORPHONE HCL 1 MG/ML IJ SOLN
1.0000 mg | Freq: Once | INTRAMUSCULAR | Status: AC
  Administered 2018-01-10: 1 mg via INTRAVENOUS
  Filled 2018-01-10: qty 1

## 2018-01-10 MED ORDER — SODIUM CHLORIDE 0.9 % IV BOLUS
1000.0000 mL | Freq: Once | INTRAVENOUS | Status: AC
  Administered 2018-01-10: 1000 mL via INTRAVENOUS

## 2018-01-10 MED ORDER — SODIUM CHLORIDE 0.9% FLUSH
3.0000 mL | Freq: Two times a day (BID) | INTRAVENOUS | Status: DC
  Administered 2018-01-10 – 2018-01-12 (×5): 3 mL via INTRAVENOUS

## 2018-01-10 MED ORDER — ENOXAPARIN SODIUM 40 MG/0.4ML ~~LOC~~ SOLN
40.0000 mg | SUBCUTANEOUS | Status: DC
  Administered 2018-01-10: 40 mg via SUBCUTANEOUS
  Filled 2018-01-10: qty 0.4

## 2018-01-10 MED ORDER — ACETAMINOPHEN 500 MG PO TABS
1000.0000 mg | ORAL_TABLET | Freq: Three times a day (TID) | ORAL | Status: DC
  Administered 2018-01-10 – 2018-01-13 (×8): 1000 mg via ORAL
  Filled 2018-01-10 (×8): qty 2

## 2018-01-10 MED ORDER — GADOBENATE DIMEGLUMINE 529 MG/ML IV SOLN
17.0000 mL | Freq: Once | INTRAVENOUS | Status: AC
  Administered 2018-01-10: 17 mL via INTRAVENOUS

## 2018-01-10 MED ORDER — KETOROLAC TROMETHAMINE 15 MG/ML IJ SOLN
15.0000 mg | Freq: Once | INTRAMUSCULAR | Status: AC
  Administered 2018-01-10: 15 mg via INTRAVENOUS
  Filled 2018-01-10: qty 1

## 2018-01-10 MED ORDER — MORPHINE SULFATE (PF) 4 MG/ML IV SOLN: 6.0000 mg | Freq: Once | INTRAVENOUS | Status: DC

## 2018-01-10 MED ORDER — SODIUM CHLORIDE 0.9 % IV SOLN: 250.0000 mL | INTRAVENOUS | Status: DC | PRN

## 2018-01-10 MED ORDER — PREDNISONE 20 MG PO TABS
40.0000 mg | ORAL_TABLET | Freq: Every day | ORAL | Status: DC
  Administered 2018-01-11: 40 mg via ORAL
  Filled 2018-01-10: qty 2

## 2018-01-10 MED ORDER — HYDROMORPHONE HCL 1 MG/ML IJ SOLN
1.0000 mg | INTRAMUSCULAR | Status: DC | PRN
  Administered 2018-01-11 – 2018-01-12 (×2): 1 mg via INTRAVENOUS
  Filled 2018-01-10 (×2): qty 1

## 2018-01-10 NOTE — ED Provider Notes (Signed)
56yM with radicular lower back/LLE pain. MRI as below. He continues to have intractable pain when standing/movement. Pt has already been treated as an outpatient with NSAIDS, narcotics, steroids and muscle relaxants.    Discussed with Dr Ronnald Ramp, neurosurgery. Admission to medical service for pain control purposes. His plan at this point would to still be to try and get his symptoms under control for outpt follow-up.   Stanley Sharp  Result Date: 01/10/2018 CLINICAL DATA:  Initial evaluation for rapidly progressive back pain. Left buttock and leg pain. History of prostate cancer. EXAM: MRI LUMBAR SPINE WITHOUT AND WITH Sharp TECHNIQUE: Multiplanar and multiecho pulse sequences of the lumbar spine were obtained without and with intravenous Sharp. Sharp:  80mL MULTIHANCE GADOBENATE DIMEGLUMINE 529 MG/ML IV SOLN COMPARISON:  None available. FINDINGS: Segmentation: Normal segmentation. Lowest well-formed disc labeled the L5-S1 level. Alignment: Mild levoscoliosis. Vertebral bodies otherwise normally aligned with preservation of the normal lumbar lordosis. No listhesis. Vertebrae: Vertebral body heights maintained without evidence for acute or chronic fracture. Bone marrow signal intensity mildly heterogeneous but within normal limits. No worrisome osseous lesions. No abnormal marrow edema or enhancement. Conus medullaris and cauda equina: Conus extends to the L1 level. Conus and cauda equina appear normal. Paraspinal and other soft tissues: Paraspinous soft tissues within normal limits. Subcentimeter T2 hyperintense cyst noted within the left kidney. Visualized visceral structures otherwise unremarkable. Disc levels: L1-2:  Unremarkable. L2-3:  Negative interspace.  Mild facet hypertrophy.  No stenosis. L3-4: Minimal annular disc bulge. Mild facet hypertrophy. No canal stenosis. Mild bilateral foraminal narrowing. L4-5: Mild annular disc bulge. Mild facet and ligament flavum hypertrophy.  Resultant mild canal with bilateral lateral recess narrowing. Mild bilateral L4 foraminal stenosis. L5-S1: Mild diffuse disc bulge, asymmetric to the left. Mild left far lateral reactive endplate changes. Superimposed left subarticular disc extrusion with inferior migration. Extruded disc material measures 9 x 16 x 18 mm in size. Extension into the left lateral recess with impingement upon the descending left S1 nerve root which is displaced posteriorly (series 12, image 32). Moderate spinal stenosis. Mild left L5 foraminal narrowing. IMPRESSION: 1. Left subarticular disc extrusion with inferior migration, impinging upon the descending left S1 nerve root in the left lateral recess. 2. Mild disc bulge with facet hypertrophy at L4-5 with resultant mild canal and bilateral L4 foraminal stenosis. 3. Mild disc bulge at L3-4 with resultant mild bilateral L3 foraminal narrowing. Electronically Signed   By: Jeannine Boga M.D.   On: 01/10/2018 17:36     Stanley Manifold, MD 01/16/18 [phone]

## 2018-01-10 NOTE — ED Notes (Signed)
Admitting MD at bedside.

## 2018-01-10 NOTE — Progress Notes (Signed)
Mr Fuller Makin amitted from ED via strecther C/o lower back pain that radiate to the lateral side of left leg with some tingling but no numbness at present. Pt rated pain level as 1/10  While lying flat.  Pt alert and oriented x 4  P O C explained  With  safety precaution. Call bell within reach to call for assistance. No acute pain at the moment . Will continue to monitor.

## 2018-01-10 NOTE — ED Provider Notes (Signed)
[facility] Emergency Department Provider Note MRN:  [id number]  Arrival date & time: 01/10/18     Chief Complaint   Back Pain   History of Present Illness   Stanley Sharp is a 57 y.o. year-old male with a history of kidney stones, hypertension presenting to the ED with chief complaint of back pain.  Patient began experiencing back pain 1 week ago, patient explains that he was overactive at work one day woke up the next morning with acute worsening of his back pain after bending down to tie his shoes.  Pain is located in the left lower back/buttocks and radiates down the back of the left leg.  Progressively worsening over the past 2 days.  Patient denies any recent fevers, no IV drug use, no headache or vision change, no chest pain or shortness of breath, no abdominal pain.  No bowel or bladder dysfunction, no numbness weakness in the arms or legs.  Had an MRI scheduled today but the pain was too severe, came to the ED for evaluation.  Review of Systems  A complete 10 system review of systems was obtained and all systems are negative except as noted in the HPI and PMH.   Patient's Health History    Past Medical History:  Diagnosis Date  . Kidney stones   . No significant past medical history   . Prostate cancer [facility])     Past Surgical History:  Procedure Laterality Date  . CERVICAL SPINE SURGERY      Family History  Problem Relation Age of Onset  . Diabetes Mother   . Hyperlipidemia Father   . Stroke Father   . Heart Problems Maternal Grandmother   . Heart attack Maternal Grandfather     Social History   Socioeconomic History  . Marital status: Married    Spouse name: Not on file  . Number of children: Not on file  . Years of education: Not on file  . Highest education level: Not on file  Occupational History  . Not on file  Social Needs  . Financial resource strain: Not on file  . Food insecurity:    Worry: Not on file    Inability: Not on file  .  Transportation needs:    Medical: Not on file    Non-medical: Not on file  Tobacco Use  . Smoking status: Never Smoker  . Smokeless tobacco: Never Used  Substance and Sexual Activity  . Alcohol use: No  . Drug use: No  . Sexual activity: Not on file  Lifestyle  . Physical activity:    Days per week: Not on file    Minutes per session: Not on file  . Stress: Not on file  Relationships  . Social connections:    Talks on phone: Not on file    Gets together: Not on file    Attends religious service: Not on file    Active member of club or organization: Not on file    Attends meetings of clubs or organizations: Not on file    Relationship status: Not on file  . Intimate partner violence:    Fear of current or ex partner: Not on file    Emotionally abused: Not on file    Physically abused: Not on file    Forced sexual activity: Not on file  Other Topics Concern  . Not on file  Social History Narrative  . Not on file     Physical Exam  Vital  Signs and Nursing Notes reviewed Vitals:   01/10/18 1615 01/10/18 1626  BP: 135/75   Pulse: (!) 107   Resp:  18  Temp:    SpO2: 98%     CONSTITUTIONAL: Well-appearing, NAD NEURO:  Alert and oriented x 3, no focal deficits, normal strength and sensation throughout EYES:  eyes equal and reactive ENT/NECK:  no LAD, no JVD CARDIO: Tachycardic rate, well-perfused, normal S1 and S2 PULM:  CTAB no wheezing or rhonchi GI/GU:  normal bowel sounds, non-distended, non-tender MSK/SPINE:  No gross deformities, no edema, positive left straight leg test SKIN:  no rash, atraumatic PSYCH:  Appropriate speech and behavior  Diagnostic and Interventional Summary    EKG Interpretation  Date/Time:    Ventricular Rate:    PR Interval:    QRS Duration:   QT Interval:    QTC Calculation:   R Axis:     Text Interpretation:        Labs Reviewed  CBC  COMPREHENSIVE METABOLIC PANEL  PROTIME-INR    MR Lumbar Spine W Wo Contrast    (Results  Pending)    Medications  sodium chloride 0.9 % bolus 1,000 mL (0 mLs Intravenous Stopped 01/10/18 1611)  ketorolac (TORADOL) 15 MG/ML injection 15 mg (15 mg Intravenous Given 01/10/18 1512)  HYDROmorphone (DILAUDID) injection 1 mg (1 mg Intravenous Given 01/10/18 1512)  HYDROmorphone (DILAUDID) injection 0.5 mg (0.5 mg Intravenous Given 01/10/18 1622)     Procedures Critical Care  ED Course and Medical Decision Making  I have reviewed the triage vital signs and the nursing notes.  Pertinent labs & imaging results that were available during my care of the patient were reviewed by me and considered in my medical decision making (see below for details). Clinical Course as of Jan 10 1638  Fri Jan 10, 2018  1448 Unable to ambulate today due to his acute worsening of back pain, favoring disc rupture, but nothing to suggest spinal cord involvement, no symptoms to suggest cauda equina.  No trauma to justify imaging today.  Will attempt symptomatic control and ambulation here in the ED.  Tachycardia initially, likely related to pain, will monitor closely.  No fevers, no IV drug use, no risk factors for epidural abscess.   [MB]  3536 Patient with continued pain, but able to ambulate some in the ED.  Continued unexplained tachycardia.  Now endorsing numbness of the lateral left foot.  Now with neurologic deficits, will pursue MRI imaging.   [MB]    Clinical Course User Index [MB] Maudie Flakes, MD    Patient signed out to Dr. Wilson Singer pending MRI results.  Barth Kirks. Sedonia Small, MD [place] [email or web address]  Final Clinical Impressions(s) / ED Diagnoses     ICD-10-CM   1. Sciatica of left side M54.32     ED Discharge Orders    None         Maudie Flakes, MD 01/10/18 [phone]

## 2018-01-10 NOTE — ED Notes (Addendum)
Pt sat up in bed to obtain urine sample. Pt has no complaints other than he said he started to hurt while being upright. Urine culture sent down with urine sample just incase.

## 2018-01-10 NOTE — H&P (Signed)
History and Physical    Stanley Sharp LMB:[id number] DOB: 09/22/1960 DOA: 01/10/2018  PCP: Redmond School, MD  Patient coming from: home   Chief Complaint: pain  HPI: Stanley Sharp is a 57 y.o. male with medical history significant for prostate cancer (localized, no treatment, actively monitored), distant history nephrolithiasis, tachycardia, and low back pain, who presents with the above.  No recent trauma. Pain began about a week ago. Mainly is in buttock, lateral left leg, and calf. Worsening. Sharp. Only happens when standing, sitting, or walking. Relieved when lying flat. No weakness. Did have some numbness and tingling lateral left leg and toes today. No history spine surgery. No IV drug use. No fevers. No spine pain. Has had low back pain in past, but not for several years. Was seen twice this week by pcp. Was started on medrol dose pack which is taking. Also tried flexeril, gabapentin, and hydrocodone. Hydrocodone is only thing that helped, but that just a little. No bowel or bladder dysfunction. No leg swelling, no cough or dyspnea. No pain or tenderness when supine.  No chest pain or syncope but has noticed hr has been up past several days.   ED Course: decadron, hydromorphone, toradol, labs, mri lumbar spine, neurosurg consult dr. Ronnald Ramp  Review of Systems: As per HPI otherwise 10 point review of systems negative.    Past Medical History:  Diagnosis Date  . Kidney stones   . No significant past medical history   . Prostate cancer [facility])     Past Surgical History:  Procedure Laterality Date  . CERVICAL SPINE SURGERY       reports that he has never smoked. He has never used smokeless tobacco. He reports that he does not drink alcohol or use drugs.  No Known Allergies  Family History  Problem Relation Age of Onset  . Diabetes Mother   . Hyperlipidemia Father   . Stroke Father   . Heart Problems Maternal Grandmother   . Heart attack Maternal Grandfather     Prior to  Admission medications   Medication Sig Start Date End Date Taking? Authorizing Provider  Ascorbic Acid (VITAMIN C) 100 MG tablet Take 100 mg by mouth 2 (two) times daily.   Yes [provider]  cholecalciferol (VITAMIN D) 400 units TABS tablet Take 400 Units by mouth daily.   Yes [provider]  cyclobenzaprine (FLEXERIL) 10 MG tablet Take 10 mg by mouth 3 (three) times daily. 01/02/18  Yes [provider]  gabapentin (NEURONTIN) 100 MG capsule Take 100 mg by mouth 3 (three) times daily. 01/09/18  Yes [provider]  HYDROcodone-acetaminophen (NORCO/VICODIN) 5-325 MG tablet Take 1 tablet by mouth every 4 (four) hours as needed for pain. 01/02/18  Yes [provider]  methylPREDNISolone (MEDROL DOSEPAK) 4 MG TBPK tablet Take 4 mg by mouth See admin instructions. Follow package directions. 01/06/18  Yes [provider]  metoprolol succinate (TOPROL-XL) 25 MG 24 hr tablet Take 1 tablet (25 mg total) by mouth daily. 06/03/17  Yes Jerline Pain, MD  predniSONE (DELTASONE) 10 MG tablet Take 10 mg by mouth See admin instructions. Take 5 tablets by mouth daily for 3 days, 4 tablets for 3 days, 3 tablets for 3 days, then 2 tablets for 3 days, then stop. 01/02/18  Yes [provider]  saw palmetto 160 MG capsule Take 160 mg by mouth 3 (three) times daily.   Yes [provider]    Physical Exam: Vitals:  01/10/18 1750 01/10/18 1830 01/10/18 1900 01/10/18 1930  BP: 137/85 137/70 135/78 139/85  Pulse: (!) 115 98 (!) 101 (!) 109  Resp: 18 18  16   Temp:      TempSrc:      SpO2: 100% 99% 98% 96%  Weight:      Height:        Constitutional: No acute distress Head: Atraumatic Eyes: Conjunctiva clear ENM: Moist mucous membranes. Normal dentition.  Neck: Supple Respiratory: Clear to auscultation bilaterally, no wheezing/rales/rhonchi. Normal respiratory effort. No accessory muscle use. . Cardiovascular: Regular rate and rhythm. No  murmurs/rubs/gallops. Abdomen: Non-tender, non-distended. No masses. No rebound or guarding. Positive bowel sounds. Musculoskeletal: No joint deformity upper and lower extremities. Normal ROM, no contractures. Normal muscle tone.  Skin: No rashes, lesions, or ulcers.  Extremities: No peripheral edema. Palpable peripheral pulses. Neurologic: Alert, moving all 4 extremities. Distal sensation t light touch intact bilaterally. 5/5 lower strength. Gait not assessed Psychiatric: Normal insight and judgement.   Labs on Admission: I have personally reviewed following labs and imaging studies  CBC: Recent Labs  Lab 01/10/18 1625  WBC 8.8  HGB 14.6  HCT 44.6  MCV 93.5  PLT 177   Basic Metabolic Panel: Recent Labs  Lab 01/10/18 1625  NA 141  K 4.1  CL 107  CO2 25  GLUCOSE 118*  BUN 17  CREATININE 1.07  CALCIUM 8.9   GFR: Estimated Creatinine Clearance: 82.6 mL/min (by C-G formula based on SCr of 1.07 mg/dL). Liver Function Tests: Recent Labs  Lab 01/10/18 1625  AST 20  ALT 17  ALKPHOS 64  BILITOT 1.0  PROT 6.8  ALBUMIN 3.7   No results for input(s): LIPASE, AMYLASE in the last 168 hours. No results for input(s): AMMONIA in the last 168 hours. Coagulation Profile: Recent Labs  Lab 01/10/18 1625  INR 1.24   Cardiac Enzymes: No results for input(s): CKTOTAL, CKMB, CKMBINDEX, TROPONINI in the last 168 hours. BNP (last 3 results) No results for input(s): PROBNP in the last 8760 hours. HbA1C: No results for input(s): HGBA1C in the last 72 hours. CBG: No results for input(s): GLUCAP in the last 168 hours. Lipid Profile: No results for input(s): CHOL, HDL, LDLCALC, TRIG, CHOLHDL, LDLDIRECT in the last 72 hours. Thyroid Function Tests: No results for input(s): TSH, T4TOTAL, FREET4, T3FREE, THYROIDAB in the last 72 hours. Anemia Panel: No results for input(s): VITAMINB12, FOLATE, FERRITIN, TIBC, IRON, RETICCTPCT in the last 72 hours. Urine analysis:    Component  Value Date/Time   COLORURINE YELLOW 05/10/2009 1622   APPEARANCEUR HAZY (A) 05/10/2009 1622   LABSPEC 1.015 05/10/2009 1622   PHURINE 7.0 05/10/2009 1622   GLUCOSEU NEGATIVE 05/10/2009 1622   HGBUR LARGE (A) 05/10/2009 1622   BILIRUBINUR NEGATIVE 05/10/2009 1622   KETONESUR TRACE (A) 05/10/2009 1622   PROTEINUR TRACE (A) 05/10/2009 1622   UROBILINOGEN 0.2 05/10/2009 1622   NITRITE NEGATIVE 05/10/2009 1622   LEUKOCYTESUR NEGATIVE 05/10/2009 1622    Radiological Exams on Admission: Mr Lumbar Spine W Wo Contrast  Result Date: 01/10/2018 CLINICAL DATA:  Initial evaluation for rapidly progressive back pain. Left buttock and leg pain. History of prostate cancer. EXAM: MRI LUMBAR SPINE WITHOUT AND WITH CONTRAST TECHNIQUE: Multiplanar and multiecho pulse sequences of the lumbar spine were obtained without and with intravenous contrast. CONTRAST:  23m MULTIHANCE GADOBENATE DIMEGLUMINE 529 MG/ML IV SOLN COMPARISON:  None available. FINDINGS: Segmentation: Normal segmentation. Lowest well-formed disc labeled the L5-S1 level. Alignment: Mild levoscoliosis. Vertebral bodies otherwise  normally aligned with preservation of the normal lumbar lordosis. No listhesis. Vertebrae: Vertebral body heights maintained without evidence for acute or chronic fracture. Bone marrow signal intensity mildly heterogeneous but within normal limits. No worrisome osseous lesions. No abnormal marrow edema or enhancement. Conus medullaris and cauda equina: Conus extends to the L1 level. Conus and cauda equina appear normal. Paraspinal and other soft tissues: Paraspinous soft tissues within normal limits. Subcentimeter T2 hyperintense cyst noted within the left kidney. Visualized visceral structures otherwise unremarkable. Disc levels: L1-2:  Unremarkable. L2-3:  Negative interspace.  Mild facet hypertrophy.  No stenosis. L3-4: Minimal annular disc bulge. Mild facet hypertrophy. No canal stenosis. Mild bilateral foraminal narrowing.  L4-5: Mild annular disc bulge. Mild facet and ligament flavum hypertrophy. Resultant mild canal with bilateral lateral recess narrowing. Mild bilateral L4 foraminal stenosis. L5-S1: Mild diffuse disc bulge, asymmetric to the left. Mild left far lateral reactive endplate changes. Superimposed left subarticular disc extrusion with inferior migration. Extruded disc material measures 9 x 16 x 18 mm in size. Extension into the left lateral recess with impingement upon the descending left S1 nerve root which is displaced posteriorly (series 12, image 32). Moderate spinal stenosis. Mild left L5 foraminal narrowing. IMPRESSION: 1. Left subarticular disc extrusion with inferior migration, impinging upon the descending left S1 nerve root in the left lateral recess. 2. Mild disc bulge with facet hypertrophy at L4-5 with resultant mild canal and bilateral L4 foraminal stenosis. 3. Mild disc bulge at L3-4 with resultant mild bilateral L3 foraminal narrowing. Electronically Signed   By: Jeannine Boga M.D.   On: 01/10/2018 17:36      Assessment/Plan Principal Problem:   Intractable pain Active Problems:   Palpitations   Tachycardia   Prostate cancer (HCC)   Lumbar radiculopathy, acute   Sacral radiculopathy   # Acute lumbar and sacral left radiculopathy - mri today showing s1 and l3-5 disk protrusion with foraminal stenosis. No myelopathic symptoms or signs on mri. Has failed outpt pain mgmt. neurosurg (dr Ronnald Ramp) consulted, advised admission for pain control. Hx localized prostate cancer, no signs bony mets on mri, normal alk phos. No signs infection or other mass on mri. [place] controlled substance database checked, only recent rxs are those listed above in the last week. I did counsel the patient that spontaneous improvement is ultimately likely.  - tylenol and toradol stayding, hydromorphone for break-through pain - cont prednisone at 40 mg daily - appreciate neurosurg recs. Consider epidural glucorticoid  injection?  # Tachycardia - hx of, cardiology eval unrevealing, has been controlled on home metroprolol. Here tachycardic to 120s, asymptomatic. Cbc and cmp unremarkable - f/u ekg, ctm  DVT prophylaxis: scds, lovenox Code Status: full  Family Communication: wife dawne Terra [phone]  Disposition Plan: tbd  Consults called: neurosurg  Admission status: med/surg    Desma Maxim MD [facility] Pager [phone]  If 7PM-7AM, please contact night-coverage [email or web address] Password [facility]  01/10/2018, 8:21 PM

## 2018-01-10 NOTE — ED Triage Notes (Signed)
Pt presents with 9 day h/o L buttock and leg pain.  Pt reports at onset, he was bending over with low back pain; reports back pain is resolved but is having pain to L leg.

## 2018-01-10 NOTE — ED Provider Notes (Signed)
Patient placed in Quick Look pathway, seen and evaluated   Chief Complaint: Left back and buttock pain  HPI:   Last Thursday patient was getting dressed when he felt a tweak in his back, since then he has had continued left-sided buttock and leg pain.  He denies any numbness.  He is unable to get out of bed secondary to the pain.  He says that he has lost about 8 pounds since this injury happened as he is unable to sit up and eat.  No changes to bowel or bladder function.  No fevers.  He has seen his doctor twice for this and given medications, including narcotics with out significant relief.   ROS: Unable to walk secondary to pain.   Physical Exam:   Gen: No distress  Neuro: Awake and Alert  Skin: Warm    Focused Exam: 2+ DP/PT pulses bilaterally. 5/5 strength with ankle dorsiflexion and plantar flexion.    Initiation of care has begun. The patient has been counseled on the process, plan, and necessity for staying for the completion/evaluation, and the remainder of the medical screening examination    Ollen Gross 01/10/18 1410    Maudie Flakes, MD 01/10/18 [phone]

## 2018-01-11 ENCOUNTER — Other Ambulatory Visit: Payer: Self-pay

## 2018-01-11 ENCOUNTER — Encounter (HOSPITAL_COMMUNITY): Payer: Self-pay | Admitting: Student

## 2018-01-11 DIAGNOSIS — R52 Pain, unspecified: Secondary | ICD-10-CM

## 2018-01-11 LAB — HIV ANTIBODY (ROUTINE TESTING W REFLEX): HIV Screen 4th Generation wRfx: NONREACTIVE

## 2018-01-11 MED ORDER — OXYCODONE HCL 5 MG PO TABS
10.0000 mg | ORAL_TABLET | ORAL | Status: DC | PRN
  Administered 2018-01-12: 10 mg via ORAL
  Filled 2018-01-11: qty 2

## 2018-01-11 MED ORDER — DEXAMETHASONE SODIUM PHOSPHATE 4 MG/ML IJ SOLN
4.0000 mg | Freq: Four times a day (QID) | INTRAMUSCULAR | Status: AC
  Administered 2018-01-11 – 2018-01-12 (×4): 4 mg via INTRAVENOUS
  Filled 2018-01-11 (×4): qty 1

## 2018-01-11 MED ORDER — SENNOSIDES-DOCUSATE SODIUM 8.6-50 MG PO TABS
1.0000 | ORAL_TABLET | Freq: Two times a day (BID) | ORAL | Status: DC
  Administered 2018-01-11 – 2018-01-12 (×2): 1 via ORAL
  Filled 2018-01-11 (×3): qty 1

## 2018-01-11 NOTE — Evaluation (Signed)
Physical Therapy Evaluation Patient Details Name: Stanley Sharp MRN: [id number] DOB: Jul 25, 1960 Today's Date: 01/11/2018   History of Present Illness  Pt is a 57 year old male who presented to the ED 8/23 with excruciating back and left posterior leg pain that has been ongoing for about 10 days now. MRI lumbar spine showed a left subarticular disc extrusion with inferior migration at L5-S1, impinging upon the descending left S1 nerve root.   Clinical Impression  Pt admitted with above diagnosis. Pt currently with functional limitations due to the deficits listed below (see PT Problem List). At the time of PT eval pt was able to perform transfers and ambulation with gross modified independence. Initially pt reports 0/10 pain at rest, which increased to 3/10 pain towards end of gait training. At end of session faces pain scale indicated pt was ~8/10 pain and required return to sidelying for relief. Pt was OOB ~10-15 minutes throughout session. Overall feel he is safe to return home, however will keep on PT caseload to monitor progress and await final decision from neurosurgeon regarding surgery. Pt will benefit from skilled PT to increase their independence and safety with mobility to allow discharge to the venue listed below.       Follow Up Recommendations Outpatient PT;Supervision - Intermittent    Equipment Recommendations  None recommended by PT    Recommendations for Other Services       Precautions / Restrictions Precautions Precautions: Back Precaution Booklet Issued: Yes (comment) Precaution Comments: Back precautions for comfort.  Restrictions Weight Bearing Restrictions: No      Mobility  Bed Mobility Overal bed mobility: Modified Independent             General bed mobility comments: Pt was able to perform log roll technique without difficulty.   Transfers Overall transfer level: Modified independent Equipment used: None             General transfer comment: No  assistance required for pt to power up to full stand. No unsteadiness or LOB noted.   Ambulation/Gait Ambulation/Gait assistance: Modified independent (Device/Increase time) Gait Distance (Feet): 800 Feet Assistive device: None Gait Pattern/deviations: Trunk flexed Gait velocity: Decreased Gait velocity interpretation: 1.31 - 2.62 ft/sec, indicative of limited community ambulator General Gait Details: Guarded and fearful of pain initially, however was able to progress to ambulating with minimal deviation. Pt slightly flexed in the trunk. Reports slight increase in pain at lateral calf toward end of gait training.   Stairs            Wheelchair Mobility    Modified Rankin (Stroke Patients Only)       Balance Overall balance assessment: No apparent balance deficits (not formally assessed)                                           Pertinent Vitals/Pain Pain Assessment: 0-10 Pain Score: 0-No pain Pain Location: At rest in sidelying.    Home Living Family/patient expects to be discharged to:: Private residence Living Arrangements: Spouse/significant other Available Help at Discharge: Family;Available PRN/intermittently Type of Home: House Home Access: Level entry     Home Layout: One level Home Equipment: None      Prior Function Level of Independence: Independent         Comments: Been out of work since the injury. Has not been able to get comfortable sitting, not  been able to drive.      Hand Dominance        Extremity/Trunk Assessment   Upper Extremity Assessment Upper Extremity Assessment: Overall WFL for tasks assessed    Lower Extremity Assessment Lower Extremity Assessment: LLE deficits/detail LLE Deficits / Details: Increased back pain with L knee extension in sitting. Decreased strength in hamstrings 4/5. Pt reports numbness on most lateral aspect of L foot in S1 dermatome.  LLE Sensation: decreased light touch    Cervical  / Trunk Assessment Cervical / Trunk Assessment: Other exceptions Cervical / Trunk Exceptions: Slightly flexed due to pain  Communication   Communication: No difficulties  Cognition Arousal/Alertness: Awake/alert Behavior During Therapy: WFL for tasks assessed/performed Overall Cognitive Status: Within Functional Limits for tasks assessed                                        General Comments      Exercises     Assessment/Plan    PT Assessment Patient needs continued PT services  PT Problem List Decreased strength;Decreased activity tolerance;Decreased mobility;Decreased knowledge of precautions;Pain       PT Treatment Interventions DME instruction;Gait training;Stair training;Functional mobility training;Therapeutic activities;Therapeutic exercise;Neuromuscular re-education;Patient/family education    PT Goals (Current goals can be found in the Care Plan section)  Acute Rehab PT Goals Patient Stated Goal: Return to work as soon as possible PT Goal Formulation: With patient Time For Goal Achievement: 01/18/18 Potential to Achieve Goals: Good    Frequency Min 3X/week   Barriers to discharge        Co-evaluation               AM-PAC PT "6 Clicks" Daily Activity  Outcome Measure Difficulty turning over in bed (including adjusting bedclothes, sheets and blankets)?: None Difficulty moving from lying on back to sitting on the side of the bed? : None Difficulty sitting down on and standing up from a chair with arms (e.g., wheelchair, bedside commode, etc,.)?: None Help needed moving to and from a bed to chair (including a wheelchair)?: None Help needed walking in hospital room?: None Help needed climbing 3-5 steps with a railing? : A Little 6 Click Score: 23    End of Session   Activity Tolerance: Patient limited by pain Patient left: in bed;with call bell/phone within reach Nurse Communication: Mobility status PT Visit Diagnosis: Pain;Other  symptoms and signs involving the nervous system (R29.898) Pain - Right/Left: Left Pain - part of body: Leg    Time: 1201-1224 PT Time Calculation (min) (ACUTE ONLY): 23 min   Charges:   PT Evaluation $PT Eval Moderate Complexity: 1 Mod PT Treatments $Gait Training: 8-22 mins        Rolinda Roan, PT, DPT Acute Rehabilitation Services Pager: [place] 01/11/2018, 1:15 PM

## 2018-01-11 NOTE — Progress Notes (Signed)
PROGRESS NOTE  Stanley Sharp MHD:[id number] DOB: Sep 28, 1960 DOA: 01/10/2018 PCP: Redmond School, MD  HPI/Recap of past 24 hours:  No pain with laying flat, but significant pain with sitting or standing  Assessment/Plan: Principal Problem:   Intractable pain Active Problems:   Palpitations   Tachycardia   Prostate cancer (HCC)   Lumbar radiculopathy, acute   Sacral radiculopathy   Acute lumbar and sacral left radiculopathy  - mri showing s1 and l3-5 disk protrusion with foraminal stenosis. No myelopathic symptoms or signs on mri.  -Has failed outpt pain mgmt.  -neurosurg (Dr Ronnald Ramp) consulted, conservative management with steroids, antiinflammatory meds, PT for now -may need epidural injection vs surgery if symptom does not improve on conservative management -will follow neurosurg recommendation   H/o tachycardia, stable on metoprolol  Hx localized prostate cancer, under active servillance no signs bony mets on mri, normal alk phos  Code Status: full  Family Communication: patient   Disposition Plan: not ready to discharge   Consultants:  neurosurgery  Procedures:  none  Antibiotics:  none   Objective: BP 120/73 (BP Location: Right Arm)   Pulse 67   Temp 97.6 F (36.4 C) (Oral)   Resp 16   Ht 6' (1.829 m)   Wt 79.8 kg   SpO2 100%   BMI 23.87 kg/m   Intake/Output Summary (Last 24 hours) at 01/11/2018 1614 Last data filed at 01/11/2018 1300 Gross per 24 hour  Intake 480 ml  Output -  Net 480 ml   Filed Weights   01/10/18 1407  Weight: 79.8 kg    Exam: Patient is examined daily including today on 01/11/2018, exams remain the same as of yesterday except that has changed    General:  NAD  Cardiovascular: RRR  Respiratory: CTABL  Abdomen: Soft/ND/NT, positive BS  Musculoskeletal: No Edema, able to raise left leg above 45degree without significant pain when laying flat, but significant pain with sit or standing  Neuro: alert, oriented    Data Reviewed: Basic Metabolic Panel: Recent Labs  Lab 01/10/18 1625 01/10/18 2237  NA 141  --   K 4.1  --   CL 107  --   CO2 25  --   GLUCOSE 118*  --   BUN 17  --   CREATININE 1.07 1.02  CALCIUM 8.9  --    Liver Function Tests: Recent Labs  Lab 01/10/18 1625  AST 20  ALT 17  ALKPHOS 64  BILITOT 1.0  PROT 6.8  ALBUMIN 3.7   No results for input(s): LIPASE, AMYLASE in the last 168 hours. No results for input(s): AMMONIA in the last 168 hours. CBC: Recent Labs  Lab 01/10/18 1625 01/10/18 2237  WBC 8.8 10.3  HGB 14.6 14.5  HCT 44.6 44.7  MCV 93.5 93.7  PLT 236 219   Cardiac Enzymes:   No results for input(s): CKTOTAL, CKMB, CKMBINDEX, TROPONINI in the last 168 hours. BNP (last 3 results) No results for input(s): BNP in the last 8760 hours.  ProBNP (last 3 results) No results for input(s): PROBNP in the last 8760 hours.  CBG: No results for input(s): GLUCAP in the last 168 hours.  No results found for this or any previous visit (from the past 240 hour(s)).   Studies: Mr Lumbar Spine W Wo Contrast  Result Date: 01/10/2018 CLINICAL DATA:  Initial evaluation for rapidly progressive back pain. Left buttock and leg pain. History of prostate cancer. EXAM: MRI LUMBAR SPINE WITHOUT AND WITH CONTRAST TECHNIQUE: Multiplanar and  multiecho pulse sequences of the lumbar spine were obtained without and with intravenous contrast. CONTRAST:  74m MULTIHANCE GADOBENATE DIMEGLUMINE 529 MG/ML IV SOLN COMPARISON:  None available. FINDINGS: Segmentation: Normal segmentation. Lowest well-formed disc labeled the L5-S1 level. Alignment: Mild levoscoliosis. Vertebral bodies otherwise normally aligned with preservation of the normal lumbar lordosis. No listhesis. Vertebrae: Vertebral body heights maintained without evidence for acute or chronic fracture. Bone marrow signal intensity mildly heterogeneous but within normal limits. No worrisome osseous lesions. No abnormal marrow edema or  enhancement. Conus medullaris and cauda equina: Conus extends to the L1 level. Conus and cauda equina appear normal. Paraspinal and other soft tissues: Paraspinous soft tissues within normal limits. Subcentimeter T2 hyperintense cyst noted within the left kidney. Visualized visceral structures otherwise unremarkable. Disc levels: L1-2:  Unremarkable. L2-3:  Negative interspace.  Mild facet hypertrophy.  No stenosis. L3-4: Minimal annular disc bulge. Mild facet hypertrophy. No canal stenosis. Mild bilateral foraminal narrowing. L4-5: Mild annular disc bulge. Mild facet and ligament flavum hypertrophy. Resultant mild canal with bilateral lateral recess narrowing. Mild bilateral L4 foraminal stenosis. L5-S1: Mild diffuse disc bulge, asymmetric to the left. Mild left far lateral reactive endplate changes. Superimposed left subarticular disc extrusion with inferior migration. Extruded disc material measures 9 x 16 x 18 mm in size. Extension into the left lateral recess with impingement upon the descending left S1 nerve root which is displaced posteriorly (series 12, image 32). Moderate spinal stenosis. Mild left L5 foraminal narrowing. IMPRESSION: 1. Left subarticular disc extrusion with inferior migration, impinging upon the descending left S1 nerve root in the left lateral recess. 2. Mild disc bulge with facet hypertrophy at L4-5 with resultant mild canal and bilateral L4 foraminal stenosis. 3. Mild disc bulge at L3-4 with resultant mild bilateral L3 foraminal narrowing. Electronically Signed   By: BJeannine BogaM.D.   On: 01/10/2018 17:36    Scheduled Meds: . acetaminophen  1,000 mg Oral Q8H  . dexamethasone  4 mg Intravenous Q6H  . ketorolac  15 mg Intravenous Q8H  . metoprolol succinate  25 mg Oral QPM  .  morphine injection  6 mg Intravenous Once  . senna-docusate  1 tablet Oral BID  . sodium chloride flush  3 mL Intravenous Q12H    Continuous Infusions: . sodium chloride       Time spent:  172ms I have personally reviewed and interpreted on  01/11/2018 daily labs,  imagings as discussed above under date review session and assessment and plans.  I reviewed all nursing notes, pharmacy notes, consultant notes,  vitals, pertinent old records  I have discussed plan of care as described above with RN , patient on 01/11/2018   FaFlorencia ReasonsD, PhD  [facility] Pager [phone] 7PM-7AM, please contact night-coverage at [email or web address], password [email or web address]/24/2019, 4:14 PM  LOS: 1 day

## 2018-01-11 NOTE — Consult Note (Signed)
Reason for Consult: HNP Referring Physician: EDP  Stanley Sharp is an 57 y.o. male.   HPI:  Very pleasant 57 year old presented to the ED last night with severe back and leg left pain. This pain started about 10 days ago. He states that the pain is a 10/10 when he is up and walking around. It is constant and sharp. He denies any NT or W down his legs. The pain radiates down the posterior aspect of his left leg. Denies any chang in his bowel or bladder habits. Laying down flat helps with his pain. He is unable to sit up and walk because the pain is so severe.   Past Medical History:  Diagnosis Date  . Kidney stones   . No significant past medical history   . Prostate cancer [facility])     Past Surgical History:  Procedure Laterality Date  . CERVICAL SPINE SURGERY      Not on File  Social History   Tobacco Use  . Smoking status: Never Smoker  . Smokeless tobacco: Never Used  Substance Use Topics  . Alcohol use: No    Family History  Problem Relation Age of Onset  . Diabetes Mother   . Hyperlipidemia Father   . Stroke Father   . Heart Problems Maternal Grandmother   . Heart attack Maternal Grandfather      Review of Systems  Positive ROS: neg  All other systems have been reviewed and were otherwise negative with the exception of those mentioned in the HPI and as above.  Objective: Vital signs in last 24 hours: Temp:  [97.4 F (36.3 C)-99.4 F (37.4 C)] 97.6 F (36.4 C) (08/24 0835) Pulse Rate:  [72-129] 72 (08/24 0835) Resp:  [16-20] 18 (08/24 0327) BP: (115-155)/(70-108) 134/78 (08/24 0835) SpO2:  [94 %-100 %] 100 % (08/24 0835) Weight:  [79.8 kg] 79.8 kg (08/23 1407)  General Appearance: Alert, cooperative, no distress, appears stated age Head: Normocephalic, without obvious abnormality, atraumatic Eyes: PERRL, conjunctiva/corneas clear, EOM's intact, fundi benign, both eyes      Lungs: respirations unlabored Heart: Regular rate and rhythm Skin: Skin color, texture,  turgor normal, no rashes or lesions  NEUROLOGIC:   Mental status: A&O x4, no aphasia, good attention span, Memory and fund of knowledge Motor Exam - grossly normal, normal tone and bulk Sensory Exam - grossly normal Reflexes: symmetric, no pathologic reflexes, No Hoffman's, No clonus Coordination - not tested Gait -not tested Balance - not tested Cranial Nerves: I: smell Not tested  II: visual acuity  OS: na   OD: na  II: visual fields Full to confrontation  II: pupils   III,VII: ptosis None  III,IV,VI: extraocular muscles  Full ROM  V: mastication   V: facial light touch sensation    V,VII: corneal reflex    VII: facial muscle function - upper    VII: facial muscle function - lower   VIII: hearing   IX: soft palate elevation    IX,X: gag reflex   XI: trapezius strength    XI: sternocleidomastoid strength   XI: neck flexion strength    XII: tongue strength      Data Review Lab Results  Component Value Date   WBC 10.3 01/10/2018   HGB 14.5 01/10/2018   HCT 44.7 01/10/2018   MCV 93.7 01/10/2018   PLT 219 01/10/2018   Lab Results  Component Value Date   NA 141 01/10/2018   K 4.1 01/10/2018   CL 107 01/10/2018  CO2 25 01/10/2018   BUN 17 01/10/2018   CREATININE 1.02 01/10/2018   GLUCOSE 118 (H) 01/10/2018   Lab Results  Component Value Date   INR 1.24 01/10/2018    Radiology: Mr Lumbar Spine W Wo Contrast  Result Date: 01/10/2018 CLINICAL DATA:  Initial evaluation for rapidly progressive back pain. Left buttock and leg pain. History of prostate cancer. EXAM: MRI LUMBAR SPINE WITHOUT AND WITH CONTRAST TECHNIQUE: Multiplanar and multiecho pulse sequences of the lumbar spine were obtained without and with intravenous contrast. CONTRAST:  40mL MULTIHANCE GADOBENATE DIMEGLUMINE 529 MG/ML IV SOLN COMPARISON:  None available. FINDINGS: Segmentation: Normal segmentation. Lowest well-formed disc labeled the L5-S1 level. Alignment: Mild levoscoliosis. Vertebral bodies  otherwise normally aligned with preservation of the normal lumbar lordosis. No listhesis. Vertebrae: Vertebral body heights maintained without evidence for acute or chronic fracture. Bone marrow signal intensity mildly heterogeneous but within normal limits. No worrisome osseous lesions. No abnormal marrow edema or enhancement. Conus medullaris and cauda equina: Conus extends to the L1 level. Conus and cauda equina appear normal. Paraspinal and other soft tissues: Paraspinous soft tissues within normal limits. Subcentimeter T2 hyperintense cyst noted within the left kidney. Visualized visceral structures otherwise unremarkable. Disc levels: L1-2:  Unremarkable. L2-3:  Negative interspace.  Mild facet hypertrophy.  No stenosis. L3-4: Minimal annular disc bulge. Mild facet hypertrophy. No canal stenosis. Mild bilateral foraminal narrowing. L4-5: Mild annular disc bulge. Mild facet and ligament flavum hypertrophy. Resultant mild canal with bilateral lateral recess narrowing. Mild bilateral L4 foraminal stenosis. L5-S1: Mild diffuse disc bulge, asymmetric to the left. Mild left far lateral reactive endplate changes. Superimposed left subarticular disc extrusion with inferior migration. Extruded disc material measures 9 x 16 x 18 mm in size. Extension into the left lateral recess with impingement upon the descending left S1 nerve root which is displaced posteriorly (series 12, image 32). Moderate spinal stenosis. Mild left L5 foraminal narrowing. IMPRESSION: 1. Left subarticular disc extrusion with inferior migration, impinging upon the descending left S1 nerve root in the left lateral recess. 2. Mild disc bulge with facet hypertrophy at L4-5 with resultant mild canal and bilateral L4 foraminal stenosis. 3. Mild disc bulge at L3-4 with resultant mild bilateral L3 foraminal narrowing. Electronically Signed   By: Jeannine Boga M.D.   On: 01/10/2018 17:36    Assessment/Plan: 57 year old presented to the ED last  night with excruciating back and left posterior leg pain that has been ongoing for about 10 days now. MRI lumbar spine showed a left subarticular disc extrusion with inferior migration at L5-S1, impinging upon the descending left S1 nerve root. We will try to manage this with pain medication and steroids for now to see if this pain subsides. If his pain is not management with medical therapies we will consider doing a microdiscectomy at L5-S1 on the left. He will need to try and get up to walk today to see if the pain is tolerable with medications.   Ocie Cornfield [facility] 01/11/2018 8:58 AM

## 2018-01-12 LAB — CBC WITH DIFFERENTIAL/PLATELET
ABS IMMATURE GRANULOCYTES: 0.2 10*3/uL — AB (ref 0.0–0.1)
BASOS ABS: 0 10*3/uL (ref 0.0–0.1)
BASOS PCT: 0 %
Eosinophils Absolute: 0 10*3/uL (ref 0.0–0.7)
Eosinophils Relative: 0 %
HCT: 46.7 % (ref 39.0–52.0)
HEMOGLOBIN: 15.3 g/dL (ref 13.0–17.0)
IMMATURE GRANULOCYTES: 1 %
LYMPHS PCT: 4 %
Lymphs Abs: 0.8 10*3/uL (ref 0.7–4.0)
MCH: 30.5 pg (ref 26.0–34.0)
MCHC: 32.8 g/dL (ref 30.0–36.0)
MCV: 93 fL (ref 78.0–100.0)
Monocytes Absolute: 1.2 10*3/uL — ABNORMAL HIGH (ref 0.1–1.0)
Monocytes Relative: 7 %
NEUTROS ABS: 15.8 10*3/uL — AB (ref 1.7–7.7)
NEUTROS PCT: 88 %
PLATELETS: 242 10*3/uL (ref 150–400)
RBC: 5.02 MIL/uL (ref 4.22–5.81)
RDW: 13 % (ref 11.5–15.5)
WBC: 18 10*3/uL — ABNORMAL HIGH (ref 4.0–10.5)

## 2018-01-12 LAB — BASIC METABOLIC PANEL
ANION GAP: 7 (ref 5–15)
BUN: 23 mg/dL — ABNORMAL HIGH (ref 6–20)
CO2: 29 mmol/L (ref 22–32)
Calcium: 9.5 mg/dL (ref 8.9–10.3)
Chloride: 104 mmol/L (ref 98–111)
Creatinine, Ser: 0.99 mg/dL (ref 0.61–1.24)
GFR calc Af Amer: >60 mL/min (ref >60)
Glucose, Bld: 123 mg/dL — ABNORMAL HIGH (ref 70–99)
POTASSIUM: 4.6 mmol/L (ref 3.5–5.1)
SODIUM: 140 mmol/L (ref 135–145)

## 2018-01-12 LAB — MAGNESIUM: MAGNESIUM: 2.2 mg/dL (ref 1.7–2.4)

## 2018-01-12 MED ORDER — ALUM & MAG HYDROXIDE-SIMETH 200-200-20 MG/5ML PO SUSP
30.0000 mL | Freq: Four times a day (QID) | ORAL | Status: DC | PRN
  Administered 2018-01-12: 30 mL via ORAL
  Filled 2018-01-12: qty 30

## 2018-01-12 NOTE — Plan of Care (Signed)
Patient stable, discussed POC with patient, agreeable with plan, NPO after midnight, denies question/concerns at this time.

## 2018-01-12 NOTE — Progress Notes (Signed)
PROGRESS NOTE  CAMBRIDGE DELEO OAC:[id number] DOB: 22-May-1960 DOA: 01/10/2018 PCP: Redmond School, MD  HPI/Recap of past 24 hours:  C/o severe left buttock and left distal leg pain when trying to ambulate, No pain with laying flat, but left leg numbness has worsened He wants to have surgery done  Wife at bedside  Assessment/Plan: Principal Problem:   Intractable pain Active Problems:   Palpitations   Tachycardia   Prostate cancer (HCC)   Lumbar radiculopathy, acute   Sacral radiculopathy   Acute lumbar and sacral left radiculopathy  - mri showing s1 and l3-5 disk protrusion with foraminal stenosis. No myelopathic symptoms or signs on mri.  -Has failed outpt pain mgmt.  -neurosurg (Dr Ronnald Ramp) consulted, conservative management with steroids, antiinflammatory meds, PT for now -plan for surgery if symptom does not improve with conservative management -will follow neurosurg recommendation   H/o tachycardia, stable on metoprolol  Hx localized prostate cancer, under active servillance no signs bony mets on mri, normal alk phos  Code Status: full  Family Communication: patient and wife  Disposition Plan: not ready to discharge   Consultants:  neurosurgery  Procedures:  none  Antibiotics:  none   Objective: BP 120/74 (BP Location: Right Arm)   Pulse (!) 101   Temp 98.9 F (37.2 C) (Oral)   Resp 15   Ht 6' (1.829 m)   Wt 79.8 kg   SpO2 100%   BMI 23.87 kg/m   Intake/Output Summary (Last 24 hours) at 01/12/2018 1240 Last data filed at 01/11/2018 1700 Gross per 24 hour  Intake 480 ml  Output -  Net 480 ml   Filed Weights   01/10/18 1407  Weight: 79.8 kg    Exam: Patient is examined daily including today on 01/12/2018, exams remain the same as of yesterday except that has changed    General:  NAD  Cardiovascular: RRR  Respiratory: CTABL  Abdomen: Soft/ND/NT, positive BS  Musculoskeletal: No Edema, able to raise left leg above 45degree without  significant pain when laying flat, but significant pain with sit or standing  Neuro: alert, oriented   Data Reviewed: Basic Metabolic Panel: Recent Labs  Lab 01/10/18 1625 01/10/18 2237 01/12/18 0600  NA 141  --  140  K 4.1  --  4.6  CL 107  --  104  CO2 25  --  29  GLUCOSE 118*  --  123*  BUN 17  --  23*  CREATININE 1.07 1.02 0.99  CALCIUM 8.9  --  9.5  MG  --   --  2.2   Liver Function Tests: Recent Labs  Lab 01/10/18 1625  AST 20  ALT 17  ALKPHOS 64  BILITOT 1.0  PROT 6.8  ALBUMIN 3.7   No results for input(s): LIPASE, AMYLASE in the last 168 hours. No results for input(s): AMMONIA in the last 168 hours. CBC: Recent Labs  Lab 01/10/18 1625 01/10/18 2237 01/12/18 0600  WBC 8.8 10.3 18.0*  NEUTROABS  --   --  15.8*  HGB 14.6 14.5 15.3  HCT 44.6 44.7 46.7  MCV 93.5 93.7 93.0  PLT 236 219 242   Cardiac Enzymes:   No results for input(s): CKTOTAL, CKMB, CKMBINDEX, TROPONINI in the last 168 hours. BNP (last 3 results) No results for input(s): BNP in the last 8760 hours.  ProBNP (last 3 results) No results for input(s): PROBNP in the last 8760 hours.  CBG: No results for input(s): GLUCAP in the last 168 hours.  No  results found for this or any previous visit (from the past 240 hour(s)).   Studies: No results found.  Scheduled Meds: . acetaminophen  1,000 mg Oral Q8H  . ketorolac  15 mg Intravenous Q8H  . metoprolol succinate  25 mg Oral QPM  .  morphine injection  6 mg Intravenous Once  . senna-docusate  1 tablet Oral BID  . sodium chloride flush  3 mL Intravenous Q12H    Continuous Infusions: . sodium chloride       Time spent: 69mns I have personally reviewed and interpreted on  01/12/2018 daily labs,  imagings as discussed above under date review session and assessment and plans.  I reviewed all nursing notes, pharmacy notes, consultant notes,  vitals, pertinent old records  I have discussed plan of care as described above with RN ,  patient on 01/12/2018   FFlorencia ReasonsMD, PhD  [facility] Pager [phone] If 7PM-7AM, please contact night-coverage at [email or web address], password [email or web address]8/25/2019, 12:40 PM  LOS: 2 days

## 2018-01-12 NOTE — Progress Notes (Signed)
Patient ID: Stanley Sharp, male   DOB: 1960-10-03, 57 y.o.   MRN: [id number] doing about the same. Complains of severe left buttock and left distal leg pain when ambulatory. No weakness. He has good plantar and dorsiflexion strength. Looks comfortable lying in bed. We'll then give this some more time but if he doesn't get better I would recommend a microdiscectomy

## 2018-01-13 NOTE — Progress Notes (Signed)
Patient ID: Stanley Sharp, male   DOB: 30-Jul-1960, 57 y.o.   MRN: [id number] Pt seen and examined, normal strength but states pain no better and limits his ability to get OOB, wants to pursue surgical options. Will look into scheduling

## 2018-01-13 NOTE — Progress Notes (Signed)
Physical Therapy Treatment Patient Details Name: Stanley Sharp MRN: [id number] DOB: 06-28-1960 Today's Date: 01/13/2018    History of Present Illness Pt is a 57 year old male who presented to the ED 8/23 with excruciating back and left posterior leg pain that has been ongoing for about 10 days now. MRI lumbar spine showed a left subarticular disc extrusion with inferior migration at L5-S1, impinging upon the descending left S1 nerve root.     PT Comments    Pt continues to be limited by LLE when OOB. Today, pt was able to mobilize for 5 minutes 45 seconds prior to any pain symptoms initiating. This is a large improvement from eval on 8/24, however today when pain came on it was very intense and pt required immediate return to supine to get relief. Will continue to follow.    Follow Up Recommendations  Outpatient PT;Supervision - Intermittent     Equipment Recommendations  None recommended by PT    Recommendations for Other Services       Precautions / Restrictions Precautions Precautions: Back Precaution Booklet Issued: Yes (comment) Precaution Comments: Back precautions for comfort.  Restrictions Weight Bearing Restrictions: No    Mobility  Bed Mobility Overal bed mobility: Modified Independent             General bed mobility comments: Pt was able to perform log roll technique without difficulty.   Transfers Overall transfer level: Modified independent Equipment used: None             General transfer comment: No assistance required for pt to power up to full stand. No unsteadiness or LOB noted.   Ambulation/Gait Ambulation/Gait assistance: Modified independent (Device/Increase time)   Assistive device: None Gait Pattern/deviations: Trunk flexed Gait velocity: Decreased Gait velocity interpretation: 1.31 - 2.62 ft/sec, indicative of limited community ambulator General Gait Details: Continues to appear slightly guarded but overall ambulating well. Reports no  pain initially but increase in pain at lateral calf after pt had been ambulating approximately 5 minutes 45 seconds.    Stairs Stairs: Yes Stairs assistance: Modified independent (Device/Increase time) Stair Management: Two rails;Step to pattern;Forwards Number of Stairs: 5 General stair comments: VC's for sequencing and general safety.    Wheelchair Mobility    Modified Rankin (Stroke Patients Only)       Balance Overall balance assessment: No apparent balance deficits (not formally assessed)                                          Cognition Arousal/Alertness: Awake/alert Behavior During Therapy: WFL for tasks assessed/performed Overall Cognitive Status: Within Functional Limits for tasks assessed                                        Exercises      General Comments        Pertinent Vitals/Pain Pain Assessment: Faces Faces Pain Scale: Hurts whole lot Pain Location: 0/10 at rest in supine at beginning of session. Pt reports excrutiating pain in LLE after gait training and required return to supine. Pain Intervention(s): Other (comment)(Position change)    Home Living                      Prior Function  PT Goals (current goals can now be found in the care plan section) Acute Rehab PT Goals Patient Stated Goal: Return to work as soon as possible PT Goal Formulation: With patient Time For Goal Achievement: 01/18/18 Potential to Achieve Goals: Good Progress towards PT goals: Progressing toward goals    Frequency    Min 3X/week      PT Plan Current plan remains appropriate    Co-evaluation              AM-PAC PT "6 Clicks" Daily Activity  Outcome Measure  Difficulty turning over in bed (including adjusting bedclothes, sheets and blankets)?: None Difficulty moving from lying on back to sitting on the side of the bed? : None Difficulty sitting down on and standing up from a chair with arms  (e.g., wheelchair, bedside commode, etc,.)?: None Help needed moving to and from a bed to chair (including a wheelchair)?: None Help needed walking in hospital room?: None Help needed climbing 3-5 steps with a railing? : None 6 Click Score: 24    End of Session   Activity Tolerance: Patient limited by pain Patient left: in bed;with call bell/phone within reach Nurse Communication: Mobility status PT Visit Diagnosis: Pain;Other symptoms and signs involving the nervous system (R29.898) Pain - Right/Left: Left Pain - part of body: Leg     Time: 5916-3846 PT Time Calculation (min) (ACUTE ONLY): 11 min  Charges:  $Gait Training: 8-22 mins                     Rolinda Roan, PT, DPT Acute Rehabilitation Services Pager: [place] 01/13/2018, 10:17 AM

## 2018-01-13 NOTE — Progress Notes (Signed)
Pt discharge via wheelchair.  VSS.

## 2018-01-13 NOTE — Discharge Summary (Signed)
Discharge Summary  Stanley Sharp EVO:[id number] DOB: 01/12/1961  PCP: Redmond School, MD  Admit date: 01/10/2018 Discharge date: 01/13/2018  Time spent: 45mns  Recommendations for Outpatient Follow-up:  1. F/u with PMD within a week  for hospital discharge follow up, repeat cbc/bmp at follow up 2. follow up with neurosurgery Dr JRonnald Ramp(Dr JRonnald Rampupdated RN to discharge patient today , and tentatively schedule surgery outpatient, possible tomorrow, Dr JRonnald Rampalso make patient and wife aware of the plan, patient desire to  discharge home today)  Discharge Diagnoses:  Active Hospital Problems   Diagnosis Date Noted  . Intractable pain 01/10/2018  . Lumbar radiculopathy, acute 01/10/2018  . Sacral radiculopathy 01/10/2018  . Prostate cancer ([facility] 01/30/2016  . Palpitations 06/14/2014  . Tachycardia 06/14/2014    Resolved Hospital Problems  No resolved problems to display.    Discharge Condition: stable  Diet recommendation: regular diet  Filed Weights   01/10/18 1407  Weight: 79.8 kg    History of present illness: (per admitting MD Dr WSi Raider PCP: FRedmond School MD  Patient coming from: home   Chief Complaint: pain  HPI: Stanley FAULKis a 57y.o. male with medical history significant for prostate cancer (localized, no treatment, actively monitored), distant history nephrolithiasis, tachycardia, and low back pain, who presents with the above.  No recent trauma. Pain began about a week ago. Mainly is in buttock, lateral left leg, and calf. Worsening. Sharp. Only happens when standing, sitting, or walking. Relieved when lying flat. No weakness. Did have some numbness and tingling lateral left leg and toes today. No history spine surgery. No IV drug use. No fevers. No spine pain. Has had low back pain in past, but not for several years. Was seen twice this week by pcp. Was started on medrol dose pack which is taking. Also tried flexeril, gabapentin, and hydrocodone. Hydrocodone is  only thing that helped, but that just a little. No bowel or bladder dysfunction. No leg swelling, no cough or dyspnea. No pain or tenderness when supine.  No chest pain or syncope but has noticed hr has been up past several days.   ED Course: decadron, hydromorphone, toradol, labs, mri lumbar spine, neurosurg consult dr. jPosey BoyerCourse:  Principal Problem:   Intractable pain Active Problems:   Palpitations   Tachycardia   Prostate cancer (HCC)   Lumbar radiculopathy, acute   Sacral radiculopathy  Acute lumbar and sacral left radiculopathy  - mri showing s1 and l3-5 disk protrusion with foraminal stenosis. No myelopathic symptoms or signs on mri.  -Has failed outpt pain mgmt.  -neurosurg (Dr jRonnald Ramp consulted, conservative management with steroids, antiinflammatory meds, PT for now -plan for outpatient surgery, possible tomorrow -patient wants to go home today, he is cleared to d/c home by neurosurgery Dr JRonnald Ramp  H/o tachycardia, stable on metoprolol  Hx localized prostate cancer, under active servillance no signs bony mets on mri, normal alk phos  Code Status: full  Family Communication: patient   Disposition Plan: home and follow up with Dr JRonnald Ramp  Consultants:  neurosurgery  Procedures:  none  Antibiotics:  none  Discharge Exam: BP 127/83 (BP Location: Right Arm)   Pulse 70   Temp 98.3 F (36.8 C) (Oral)   Resp 16   Ht 6' (1.829 m)   Wt 79.8 kg   SpO2 99%   BMI 23.87 kg/m   General: NAD Cardiovascular: RRR Respiratory: CTABL  Discharge Instructions You were cared for by a  hospitalist during your hospital stay. If you have any questions about your discharge medications or the care you received while you were in the hospital after you are discharged, you can call the unit and asked to speak with the hospitalist on call if the hospitalist that took care of you is not available. Once you are discharged, your primary care physician  will handle any further medical issues. Please note that NO REFILLS for any discharge medications will be authorized once you are discharged, as it is imperative that you return to your primary care physician (or establish a relationship with a primary care physician if you do not have one) for your aftercare needs so that they can reassess your need for medications and monitor your lab values.  Discharge Instructions    Diet general   Complete by:  As directed    Increase activity slowly   Complete by:  As directed      Allergies as of 01/13/2018   Not on File     Medication List    STOP taking these medications   methylPREDNISolone 4 MG Tbpk tablet Commonly known as:  MEDROL DOSEPAK     TAKE these medications   cholecalciferol 400 units Tabs tablet Commonly known as:  VITAMIN D Take 400 Units by mouth daily.   cyclobenzaprine 10 MG tablet Commonly known as:  FLEXERIL Take 10 mg by mouth 3 (three) times daily.   gabapentin 100 MG capsule Commonly known as:  NEURONTIN Take 100 mg by mouth 3 (three) times daily.   HYDROcodone-acetaminophen 5-325 MG tablet Commonly known as:  NORCO/VICODIN Take 1 tablet by mouth every 4 (four) hours as needed for pain.   metoprolol succinate 25 MG 24 hr tablet Commonly known as:  TOPROL-XL Take 1 tablet (25 mg total) by mouth daily.   predniSONE 10 MG tablet Commonly known as:  DELTASONE Take 10 mg by mouth See admin instructions. Take 5 tablets by mouth daily for 3 days, 4 tablets for 3 days, 3 tablets for 3 days, then 2 tablets for 3 days, then stop.   saw palmetto 160 MG capsule Take 160 mg by mouth 3 (three) times daily.   vitamin C 100 MG tablet Take 100 mg by mouth 2 (two) times daily.      Not on File Follow-up Information    Redmond School, MD Follow up.   Specialty:  Internal Medicine Contact information: [address] [place] [address] [phone]        Eustace Moore, MD Follow up.   Specialty:   Neurosurgery Contact information: [address] [place] [address] [phone]            The results of significant diagnostics from this hospitalization (including imaging, microbiology, ancillary and laboratory) are listed below for reference.    Significant Diagnostic Studies: Mr Lumbar Spine W Wo Contrast  Result Date: 01/10/2018 CLINICAL DATA:  Initial evaluation for rapidly progressive back pain. Left buttock and leg pain. History of prostate cancer. EXAM: MRI LUMBAR SPINE WITHOUT AND WITH CONTRAST TECHNIQUE: Multiplanar and multiecho pulse sequences of the lumbar spine were obtained without and with intravenous contrast. CONTRAST:  51m MULTIHANCE GADOBENATE DIMEGLUMINE 529 MG/ML IV SOLN COMPARISON:  None available. FINDINGS: Segmentation: Normal segmentation. Lowest well-formed disc labeled the L5-S1 level. Alignment: Mild levoscoliosis. Vertebral bodies otherwise normally aligned with preservation of the normal lumbar lordosis. No listhesis. Vertebrae: Vertebral body heights maintained without evidence for acute or chronic fracture. Bone marrow signal  intensity mildly heterogeneous but within normal limits. No worrisome osseous lesions. No abnormal marrow edema or enhancement. Conus medullaris and cauda equina: Conus extends to the L1 level. Conus and cauda equina appear normal. Paraspinal and other soft tissues: Paraspinous soft tissues within normal limits. Subcentimeter T2 hyperintense cyst noted within the left kidney. Visualized visceral structures otherwise unremarkable. Disc levels: L1-2:  Unremarkable. L2-3:  Negative interspace.  Mild facet hypertrophy.  No stenosis. L3-4: Minimal annular disc bulge. Mild facet hypertrophy. No canal stenosis. Mild bilateral foraminal narrowing. L4-5: Mild annular disc bulge. Mild facet and ligament flavum hypertrophy. Resultant mild canal with bilateral lateral recess narrowing. Mild bilateral L4 foraminal stenosis. L5-S1:  Mild diffuse disc bulge, asymmetric to the left. Mild left far lateral reactive endplate changes. Superimposed left subarticular disc extrusion with inferior migration. Extruded disc material measures 9 x 16 x 18 mm in size. Extension into the left lateral recess with impingement upon the descending left S1 nerve root which is displaced posteriorly (series 12, image 32). Moderate spinal stenosis. Mild left L5 foraminal narrowing. IMPRESSION: 1. Left subarticular disc extrusion with inferior migration, impinging upon the descending left S1 nerve root in the left lateral recess. 2. Mild disc bulge with facet hypertrophy at L4-5 with resultant mild canal and bilateral L4 foraminal stenosis. 3. Mild disc bulge at L3-4 with resultant mild bilateral L3 foraminal narrowing. Electronically Signed   By: Jeannine Boga M.D.   On: 01/10/2018 17:36    Microbiology: No results found for this or any previous visit (from the past 240 hour(s)).   Labs: Basic Metabolic Panel: Recent Labs  Lab 01/10/18 1625 01/10/18 2237 01/12/18 0600  NA 141  --  140  K 4.1  --  4.6  CL 107  --  104  CO2 25  --  29  GLUCOSE 118*  --  123*  BUN 17  --  23*  CREATININE 1.07 1.02 0.99  CALCIUM 8.9  --  9.5  MG  --   --  2.2   Liver Function Tests: Recent Labs  Lab 01/10/18 1625  AST 20  ALT 17  ALKPHOS 64  BILITOT 1.0  PROT 6.8  ALBUMIN 3.7   No results for input(s): LIPASE, AMYLASE in the last 168 hours. No results for input(s): AMMONIA in the last 168 hours. CBC: Recent Labs  Lab 01/10/18 1625 01/10/18 2237 01/12/18 0600  WBC 8.8 10.3 18.0*  NEUTROABS  --   --  15.8*  HGB 14.6 14.5 15.3  HCT 44.6 44.7 46.7  MCV 93.5 93.7 93.0  PLT 236 219 242   Cardiac Enzymes: No results for input(s): CKTOTAL, CKMB, CKMBINDEX, TROPONINI in the last 168 hours. BNP: BNP (last 3 results) No results for input(s): BNP in the last 8760 hours.  ProBNP (last 3 results) No results for input(s): PROBNP in the last  8760 hours.  CBG: No results for input(s): GLUCAP in the last 168 hours.     Signed:  Florencia Reasons MD, PhD  [facility] 01/13/2018, 5:49 PM

## 2018-01-14 DIAGNOSIS — M5127 Other intervertebral disc displacement, lumbosacral region: Secondary | ICD-10-CM | POA: Diagnosis not present

## 2018-02-06 ENCOUNTER — Encounter: Payer: Self-pay | Admitting: Cardiology

## 2018-02-14 ENCOUNTER — Other Ambulatory Visit (HOSPITAL_COMMUNITY): Payer: Self-pay | Admitting: Neurological Surgery

## 2018-02-14 ENCOUNTER — Ambulatory Visit (HOSPITAL_COMMUNITY)
Admission: RE | Admit: 2018-02-14 | Discharge: 2018-02-14 | Disposition: A | Discharge status: Home / Self Care | Payer: BLUE CROSS/BLUE SHIELD | Source: Ambulatory Visit | Attending: Neurological Surgery | Admitting: Neurological Surgery

## 2018-02-14 DIAGNOSIS — M79605 Pain in left leg: Secondary | ICD-10-CM

## 2018-02-14 DIAGNOSIS — I82812 Embolism and thrombosis of superficial veins of left lower extremities: Secondary | ICD-10-CM | POA: Diagnosis not present

## 2018-02-14 DIAGNOSIS — M79604 Pain in right leg: Secondary | ICD-10-CM | POA: Diagnosis not present

## 2018-02-14 DIAGNOSIS — M7989 Other specified soft tissue disorders: Principal | ICD-10-CM

## 2018-02-14 NOTE — Progress Notes (Addendum)
LLE venous duplex prelim: negative for DVT. Superficial thrombosis noted in the left small saphenous vein. Landry Mellow, RDMS, RVT   Called results to [facility].

## 2018-02-17 DIAGNOSIS — X32XXXD Exposure to sunlight, subsequent encounter: Secondary | ICD-10-CM | POA: Diagnosis not present

## 2018-02-17 DIAGNOSIS — D225 Melanocytic nevi of trunk: Secondary | ICD-10-CM | POA: Diagnosis not present

## 2018-02-17 DIAGNOSIS — L570 Actinic keratosis: Secondary | ICD-10-CM | POA: Diagnosis not present

## 2018-02-17 DIAGNOSIS — L720 Epidermal cyst: Secondary | ICD-10-CM | POA: Diagnosis not present

## 2018-02-17 DIAGNOSIS — Z1283 Encounter for screening for malignant neoplasm of skin: Secondary | ICD-10-CM | POA: Diagnosis not present

## 2018-02-20 ENCOUNTER — Ambulatory Visit (INDEPENDENT_AMBULATORY_CARE_PROVIDER_SITE_OTHER): Payer: BLUE CROSS/BLUE SHIELD | Admitting: Cardiology

## 2018-02-20 ENCOUNTER — Encounter: Payer: Self-pay | Admitting: Cardiology

## 2018-02-20 VITALS — BP 104/70 | HR 79 | Ht 72.0 in | Wt 190.0 lb

## 2018-02-20 DIAGNOSIS — C61 Malignant neoplasm of prostate: Secondary | ICD-10-CM | POA: Diagnosis not present

## 2018-02-20 DIAGNOSIS — M549 Dorsalgia, unspecified: Secondary | ICD-10-CM | POA: Diagnosis not present

## 2018-02-20 DIAGNOSIS — R002 Palpitations: Secondary | ICD-10-CM

## 2018-02-20 NOTE — Patient Instructions (Signed)

## 2018-02-20 NOTE — Progress Notes (Signed)
Cardiology Office Note:    Date:  02/20/2018   ID:  Stanley Sharp, DOB 09/09/1960, MRN [id number]  PCP:  Stanley School, MD  Cardiologist:  No primary care provider on file.  Electrophysiologist:  None   Referring MD: Stanley School, MD     History of Present Illness:    Stanley Sharp is a 57 y.o. male with here for follow-up of palpitations, PVCs, prior tachycardia.  His wife works at advanced heart failure clinic, [employer].  Had severe intractable back pain in late August, ended up having back surgery with Dr. Ronnald Sharp.  Left lateral foot numbness.  Superficial calf thrombosis treated with warm compress.  Heart rate immediately went faster, sometimes low 100s.  Today improved.  Back in 2012 was worked up for palpitations and was found to have PVCs.  Normal echocardiogram.  Tried metoprolol but also may have tried diltiazem in the past.  Has been off and on medications.  Denies any excessive caffeine alcohol Sudafed  Has low-grade prostate cancer, Dr. Alinda Sharp.  Caffeine sometimes can trigger palpitations.  No chest pain fevers chills nausea vomiting syncope.  Denies any fevers chills nausea vomiting syncope.  Past Medical History:  Diagnosis Date  . Kidney stones   . No significant past medical history   . Prostate cancer [facility])     Past Surgical History:  Procedure Laterality Date  . CERVICAL SPINE SURGERY      Current Medications: Current Meds  Medication Sig  . Ascorbic Acid (VITAMIN C) 100 MG tablet Take 100 mg by mouth 2 (two) times daily.  . metoprolol succinate (TOPROL-XL) 25 MG 24 hr tablet Take 1 tablet (25 mg total) by mouth daily.  . saw palmetto 160 MG capsule Take 160 mg by mouth 3 (three) times daily.     Allergies:   Patient has no allergy information on record.   Social History   Socioeconomic History  . Marital status: Married    Spouse name: Not on file  . Number of children: Not on file  . Years of education: Not on file  . Highest education level: Not  on file  Occupational History  . Not on file  Social Needs  . Financial resource strain: Not on file  . Food insecurity:    Worry: Not on file    Inability: Not on file  . Transportation needs:    Medical: Not on file    Non-medical: Not on file  Tobacco Use  . Smoking status: Never Smoker  . Smokeless tobacco: Never Used  Substance and Sexual Activity  . Alcohol use: No  . Drug use: No  . Sexual activity: Not on file  Lifestyle  . Physical activity:    Days per week: Not on file    Minutes per session: Not on file  . Stress: Not on file  Relationships  . Social connections:    Talks on phone: Not on file    Gets together: Not on file    Attends religious service: Not on file    Active member of club or organization: Not on file    Attends meetings of clubs or organizations: Not on file    Relationship status: Not on file  Other Topics Concern  . Not on file  Social History Narrative  . Not on file     Family History: The patient's family history includes Diabetes in his mother; Heart Problems in his maternal grandmother; Heart attack in his maternal grandfather; Hyperlipidemia in his  father; Stroke in his father.  ROS:   Please see the history of present illness.     All other systems reviewed and are negative.  EKGs/Labs/Other Studies Reviewed:    The following studies were reviewed today: Prior office notes, lab work, EKG  EKG:  EKG is not ordered today.  The ekg ordered today demonstrates prior10/2/18-sinus rhythm 79bpM a no other abnormalities personally viewed-prior 01/30/16-sinus rhythm 71 with no other abnormalities. Prior 06/14/14-sinus tachycardia with 2 PVCs noted. Nonspecific ST-T wave changes. Heart rate 104  Recent Labs: 01/10/2018: ALT 17 01/12/2018: BUN 23; Creatinine, Ser 0.99; Hemoglobin 15.3; Magnesium 2.2; Platelets 242; Potassium 4.6; Sodium 140  Recent Lipid Panel No results found for: CHOL, TRIG, HDL, CHOLHDL, VLDL, LDLCALC,  LDLDIRECT  Physical Exam:    VS:  BP 104/70   Pulse 79   Ht 6' (1.829 m)   Wt 190 lb (86.2 kg)   SpO2 98%   BMI 25.77 kg/m     Wt Readings from Last 3 Encounters:  02/20/18 190 lb (86.2 kg)  01/10/18 176 lb (79.8 kg)  01/30/16 193 lb (87.5 kg)     GEN:  Well nourished, well developed in no acute distress HEENT: Normal NECK: No JVD; No carotid bruits LYMPHATICS: No lymphadenopathy CARDIAC: RRR, no murmurs, rubs, gallops RESPIRATORY:  Clear to auscultation without rales, wheezing or rhonchi  ABDOMEN: Soft, non-tender, non-distended MUSCULOSKELETAL:  No edema; No deformity  SKIN: Warm and dry NEUROLOGIC:  Alert and oriented x 3 PSYCHIATRIC:  Normal affect   ASSESSMENT:    1. Palpitations   2. Prostate cancer ([place])   3. Acute left-sided back pain, unspecified back location    PLAN:    In order of problems listed above:  Tachycardia/PVCs - Toprol-XL 25 mg once a day. -Noticeably faster heart rate since back surgery, back up into the low 100s.  Today he is 58.  This is a common pain response, inflammatory response. -Was found to have left calf thrombosis, swollen ankle.  No evidence of deep vein thrombosis.  He was prescribed warm compresses, elevation.  Compression hose.  Elevation.  Back pain - Severe, ended up with back surgery.  Dr. Danella Sensing.  Left foot still with some numbness.   Medication Adjustments/Labs and Tests Ordered: Current medicines are reviewed at length with the patient today.  Concerns regarding medicines are outlined above.  No orders of the defined types were placed in this encounter.  No orders of the defined types were placed in this encounter.   Patient Instructions  Medication Instructions:  The current medical regimen is effective;  continue present plan and medications.  Follow-Up: Follow up in 1 year with Dr. Marlou Porch.  You will receive a letter in the mail 2 months before you are due.  Please call us when you receive this letter to  schedule your follow up appointment.  If you need a refill on your cardiac medications before your next appointment, please call your pharmacy.  Thank you for choosing [facility]!!        Signed, Candee Furbish, MD  02/20/2018 2:03 PM    [place]

## 2018-02-21 DIAGNOSIS — Z23 Encounter for immunization: Secondary | ICD-10-CM | POA: Diagnosis not present

## 2018-03-05 ENCOUNTER — Other Ambulatory Visit: Payer: Self-pay | Admitting: Cardiology

## 2018-03-05 DIAGNOSIS — R002 Palpitations: Secondary | ICD-10-CM

## 2018-03-31 ENCOUNTER — Ambulatory Visit (HOSPITAL_COMMUNITY)
Admission: RE | Admit: 2018-03-31 | Discharge: 2018-03-31 | Disposition: A | Discharge status: Home / Self Care | Payer: BLUE CROSS/BLUE SHIELD | Source: Ambulatory Visit | Attending: Neurological Surgery | Admitting: Neurological Surgery

## 2018-03-31 ENCOUNTER — Other Ambulatory Visit (HOSPITAL_COMMUNITY): Payer: Self-pay | Admitting: Neurological Surgery

## 2018-03-31 DIAGNOSIS — M7989 Other specified soft tissue disorders: Principal | ICD-10-CM

## 2018-03-31 DIAGNOSIS — M79605 Pain in left leg: Secondary | ICD-10-CM | POA: Insufficient documentation

## 2018-03-31 NOTE — Progress Notes (Signed)
Left lower extremity venous duplex has been completed. Negative for DVT. 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 Results were given to [facility] at Dr. Ronnald Ramp' office.  03/31/18 2:53 PM Stanley Sharp RVT

## 2018-05-01 DIAGNOSIS — C61 Malignant neoplasm of prostate: Secondary | ICD-10-CM | POA: Diagnosis not present

## 2018-05-07 DIAGNOSIS — C61 Malignant neoplasm of prostate: Secondary | ICD-10-CM | POA: Diagnosis not present

## 2018-05-19 DIAGNOSIS — M5126 Other intervertebral disc displacement, lumbar region: Secondary | ICD-10-CM | POA: Diagnosis not present

## 2018-05-27 DIAGNOSIS — J069 Acute upper respiratory infection, unspecified: Secondary | ICD-10-CM | POA: Diagnosis not present

## 2018-05-27 DIAGNOSIS — E663 Overweight: Secondary | ICD-10-CM | POA: Diagnosis not present

## 2018-05-27 DIAGNOSIS — Z1389 Encounter for screening for other disorder: Secondary | ICD-10-CM | POA: Diagnosis not present

## 2018-05-27 DIAGNOSIS — Z6826 Body mass index (BMI) 26.0-26.9, adult: Secondary | ICD-10-CM | POA: Diagnosis not present

## 2018-06-13 DIAGNOSIS — C61 Malignant neoplasm of prostate: Secondary | ICD-10-CM | POA: Diagnosis not present

## 2018-11-20 ENCOUNTER — Other Ambulatory Visit: Payer: Self-pay | Admitting: Cardiology

## 2018-11-20 DIAGNOSIS — R002 Palpitations: Secondary | ICD-10-CM

## 2019-01-09 DIAGNOSIS — C61 Malignant neoplasm of prostate: Secondary | ICD-10-CM | POA: Diagnosis not present

## 2019-01-16 DIAGNOSIS — C61 Malignant neoplasm of prostate: Secondary | ICD-10-CM | POA: Diagnosis not present

## 2019-02-16 DIAGNOSIS — D225 Melanocytic nevi of trunk: Secondary | ICD-10-CM | POA: Diagnosis not present

## 2019-02-16 DIAGNOSIS — Z1283 Encounter for screening for malignant neoplasm of skin: Secondary | ICD-10-CM | POA: Diagnosis not present

## 2019-02-21 ENCOUNTER — Other Ambulatory Visit: Payer: Self-pay | Admitting: Cardiology

## 2019-02-21 DIAGNOSIS — R002 Palpitations: Secondary | ICD-10-CM

## 2019-03-02 DIAGNOSIS — Z23 Encounter for immunization: Secondary | ICD-10-CM | POA: Diagnosis not present

## 2019-03-13 ENCOUNTER — Ambulatory Visit (INDEPENDENT_AMBULATORY_CARE_PROVIDER_SITE_OTHER): Payer: BC Managed Care – PPO | Admitting: Cardiology

## 2019-03-13 ENCOUNTER — Other Ambulatory Visit: Payer: Self-pay

## 2019-03-13 ENCOUNTER — Encounter: Payer: Self-pay | Admitting: Cardiology

## 2019-03-13 VITALS — BP 120/78 | HR 71 | Ht 72.0 in | Wt 181.0 lb

## 2019-03-13 DIAGNOSIS — R002 Palpitations: Secondary | ICD-10-CM

## 2019-03-13 NOTE — Progress Notes (Signed)
Cardiology Office Note:    Date:  03/13/2019   ID:  Stanley Sharp, DOB January 13, 1961, MRN [id number]  PCP:  Redmond School, MD  Cardiologist:  No primary care provider on file.  Electrophysiologist:  None   Referring MD: Redmond School, MD     History of Present Illness:    Stanley Sharp is a 58 y.o. male with here for follow-up of palpitations, PVCs, prior tachycardia.  His wife works at advanced heart failure clinic, [employer].  Had severe intractable back pain in late August, ended up having back surgery with Dr. Ronnald Ramp.  Left lateral foot numbness.  Superficial calf thrombosis treated with warm compress.  Heart rate immediately went faster, sometimes low 100s.  Today improved.  Back in 2012 was worked up for palpitations and was found to have PVCs.  Normal echocardiogram.  Tried metoprolol but also may have tried diltiazem in the past.  Has been off and on medications.  Denies any excessive caffeine alcohol Sudafed  Has low-grade prostate cancer, Dr. Alinda Money.  Caffeine sometimes can trigger palpitations.  No chest pain fevers chills nausea vomiting syncope.  03/13/2019-here for the follow-up of palpitations.  Occasional PVC seen on EKG.  Metoprolol.  Doing well.  No changes made.  [name], his wife is now working at the atrial fibrillation clinic.  No syncope, no bleeding no chest pain  Past Medical History:  Diagnosis Date  . Kidney stones   . No significant past medical history   . Prostate cancer [facility])     Past Surgical History:  Procedure Laterality Date  . CERVICAL SPINE SURGERY      Current Medications: Current Meds  Medication Sig  . Ascorbic Acid (VITAMIN C) 100 MG tablet Take 100 mg by mouth 2 (two) times daily.  . metoprolol succinate (TOPROL-XL) 25 MG 24 hr tablet Take 1 tablet (25 mg total) by mouth daily.  . saw palmetto 160 MG capsule Take 160 mg by mouth 3 (three) times daily.     Allergies:   Patient has no known allergies.   Social History   Socioeconomic  History  . Marital status: Married    Spouse name: Not on file  . Number of children: Not on file  . Years of education: Not on file  . Highest education level: Not on file  Occupational History  . Not on file  Social Needs  . Financial resource strain: Not on file  . Food insecurity    Worry: Not on file    Inability: Not on file  . Transportation needs    Medical: Not on file    Non-medical: Not on file  Tobacco Use  . Smoking status: Never Smoker  . Smokeless tobacco: Never Used  Substance and Sexual Activity  . Alcohol use: No  . Drug use: No  . Sexual activity: Not on file  Lifestyle  . Physical activity    Days per week: Not on file    Minutes per session: Not on file  . Stress: Not on file  Relationships  . Social Herbalist on phone: Not on file    Gets together: Not on file    Attends religious service: Not on file    Active member of club or organization: Not on file    Attends meetings of clubs or organizations: Not on file    Relationship status: Not on file  Other Topics Concern  . Not on file  Social History Narrative  . Not  on file     Family History: The patient's family history includes Diabetes in his mother; Heart Problems in his maternal grandmother; Heart attack in his maternal grandfather; Hyperlipidemia in his father; Stroke in his father.  ROS:   Please see the history of present illness.     All other systems reviewed and are negative.  EKGs/Labs/Other Studies Reviewed:    The following studies were reviewed today: Prior office notes, lab work, EKG  EKG: 03/13/2019-sinus rhythm 2 PVCs noted otherwise normal.  02/19/17-sinus rhythm 79bpM a no other abnormalities personally viewed-prior 01/30/16-sinus rhythm 71 with no other abnormalities. Prior 06/14/14-sinus tachycardia with 2 PVCs noted. Nonspecific ST-T wave changes. Heart rate 104  Recent Labs: No results found for requested labs within last 8760 hours.  Recent Lipid Panel  No results found for: CHOL, TRIG, HDL, CHOLHDL, VLDL, LDLCALC, LDLDIRECT  Physical Exam:    VS:  BP 120/78   Pulse 71   Ht 6' (1.829 m)   Wt 181 lb (82.1 kg)   BMI 24.55 kg/m     Wt Readings from Last 3 Encounters:  03/13/19 181 lb (82.1 kg)  02/20/18 190 lb (86.2 kg)  01/10/18 176 lb (79.8 kg)     GEN:  Well nourished, well developed in no acute distress HEENT: Normal NECK: No JVD; No carotid bruits LYMPHATICS: No lymphadenopathy CARDIAC: RRR, no murmurs, rubs, gallops RESPIRATORY:  Clear to auscultation without rales, wheezing or rhonchi  ABDOMEN: Soft, non-tender, non-distended MUSCULOSKELETAL:  No edema; No deformity  SKIN: Warm and dry NEUROLOGIC:  Alert and oriented x 3 PSYCHIATRIC:  Normal affect   ASSESSMENT:    1. Palpitations    PLAN:    In order of problems listed above:  Tachycardia/PVCs - Toprol-XL 25 mg once a day. -Overall doing well.  Still having a few PVCs on ECG.  Stable.  No high risk symptoms.  Back pain - Severe, ended up with back surgery.  Dr. Danella Sensing.  Feels so much better now.  Excellent.   Medication Adjustments/Labs and Tests Ordered: Current medicines are reviewed at length with the patient today.  Concerns regarding medicines are outlined above.  Orders Placed This Encounter  Procedures  . EKG 12-Lead   No orders of the defined types were placed in this encounter.   Patient Instructions  Medication Instructions:  The current medical regimen is effective;  continue present plan and medications.  *If you need a refill on your cardiac medications before your next appointment, please call your pharmacy*  Follow-Up: At [facility], you and your health needs are our priority.  As part of our continuing mission to provide you with exceptional heart care, we have created designated Provider Care Teams.  These Care Teams include your primary Cardiologist (physician) and Advanced Practice Providers (APPs -  Physician Assistants  and Nurse Practitioners) who all work together to provide you with the care you need, when you need it.  Your next appointment:   12 months  The format for your next appointment:   In Person  Provider:   You may see Dr Candee Furbish. or one of the following Advanced Practice Providers on your designated Care Team:    Truitt Merle, NP  Cecilie Kicks, NP  Kathyrn Drown, NP   Thank you for choosing [facility]!!        Signed, Candee Furbish, MD  03/13/2019 3:19 PM    [place]

## 2019-03-13 NOTE — Patient Instructions (Signed)
Medication Instructions:  The current medical regimen is effective;  continue present plan and medications.  *If you need a refill on your cardiac medications before your next appointment, please call your pharmacy*  Follow-Up: At [facility], you and your health needs are our priority.  As part of our continuing mission to provide you with exceptional heart care, we have created designated Provider Care Teams.  These Care Teams include your primary Cardiologist (physician) and Advanced Practice Providers (APPs -  Physician Assistants and Nurse Practitioners) who all work together to provide you with the care you need, when you need it.  Your next appointment:   12 months  The format for your next appointment:   In Person  Provider:   You may see Dr Candee Furbish. or one of the following Advanced Practice Providers on your designated Care Team:    Truitt Merle, NP  Cecilie Kicks, NP  Kathyrn Drown, NP   Thank you for choosing [facility]!!

## 2019-05-17 ENCOUNTER — Other Ambulatory Visit: Payer: Self-pay | Admitting: Cardiology

## 2019-05-17 DIAGNOSIS — R002 Palpitations: Secondary | ICD-10-CM

## 2019-06-30 DIAGNOSIS — C61 Malignant neoplasm of prostate: Secondary | ICD-10-CM | POA: Diagnosis not present

## 2019-07-07 DIAGNOSIS — C61 Malignant neoplasm of prostate: Secondary | ICD-10-CM | POA: Diagnosis not present

## 2019-09-21 DIAGNOSIS — Z6824 Body mass index (BMI) 24.0-24.9, adult: Secondary | ICD-10-CM | POA: Diagnosis not present

## 2019-09-21 DIAGNOSIS — Z1389 Encounter for screening for other disorder: Secondary | ICD-10-CM | POA: Diagnosis not present

## 2019-09-21 DIAGNOSIS — N200 Calculus of kidney: Secondary | ICD-10-CM | POA: Diagnosis not present

## 2019-09-21 DIAGNOSIS — R000 Tachycardia, unspecified: Secondary | ICD-10-CM | POA: Diagnosis not present

## 2019-09-21 DIAGNOSIS — Z0001 Encounter for general adult medical examination with abnormal findings: Secondary | ICD-10-CM | POA: Diagnosis not present

## 2019-09-21 DIAGNOSIS — E039 Hypothyroidism, unspecified: Secondary | ICD-10-CM | POA: Diagnosis not present

## 2019-09-21 DIAGNOSIS — R7309 Other abnormal glucose: Secondary | ICD-10-CM | POA: Diagnosis not present

## 2019-09-21 DIAGNOSIS — N400 Enlarged prostate without lower urinary tract symptoms: Secondary | ICD-10-CM | POA: Diagnosis not present

## 2019-09-29 DIAGNOSIS — Z1211 Encounter for screening for malignant neoplasm of colon: Secondary | ICD-10-CM | POA: Diagnosis not present

## 2019-10-01 ENCOUNTER — Encounter (INDEPENDENT_AMBULATORY_CARE_PROVIDER_SITE_OTHER): Payer: Self-pay | Admitting: *Deleted

## 2019-10-13 DIAGNOSIS — C61 Malignant neoplasm of prostate: Secondary | ICD-10-CM | POA: Diagnosis not present

## 2019-10-13 DIAGNOSIS — Z6824 Body mass index (BMI) 24.0-24.9, adult: Secondary | ICD-10-CM | POA: Diagnosis not present

## 2019-10-13 DIAGNOSIS — R634 Abnormal weight loss: Secondary | ICD-10-CM | POA: Diagnosis not present

## 2019-10-14 DIAGNOSIS — R634 Abnormal weight loss: Secondary | ICD-10-CM | POA: Diagnosis not present

## 2019-10-16 ENCOUNTER — Other Ambulatory Visit: Payer: Self-pay | Admitting: Internal Medicine

## 2019-10-16 DIAGNOSIS — R634 Abnormal weight loss: Secondary | ICD-10-CM

## 2019-10-20 ENCOUNTER — Other Ambulatory Visit: Payer: Self-pay | Admitting: Internal Medicine

## 2019-10-20 DIAGNOSIS — R634 Abnormal weight loss: Secondary | ICD-10-CM

## 2019-11-06 ENCOUNTER — Ambulatory Visit
Admission: RE | Admit: 2019-11-06 | Discharge: 2019-11-06 | Disposition: A | Discharge status: Home / Self Care | Payer: BC Managed Care – PPO | Source: Ambulatory Visit | Attending: Internal Medicine | Admitting: Internal Medicine

## 2019-11-06 ENCOUNTER — Other Ambulatory Visit: Payer: Self-pay

## 2019-11-06 DIAGNOSIS — R918 Other nonspecific abnormal finding of lung field: Secondary | ICD-10-CM | POA: Diagnosis not present

## 2019-11-06 DIAGNOSIS — N200 Calculus of kidney: Secondary | ICD-10-CM | POA: Diagnosis not present

## 2019-11-06 DIAGNOSIS — R634 Abnormal weight loss: Secondary | ICD-10-CM

## 2019-12-02 ENCOUNTER — Other Ambulatory Visit (INDEPENDENT_AMBULATORY_CARE_PROVIDER_SITE_OTHER): Payer: Self-pay | Admitting: *Deleted

## 2019-12-03 ENCOUNTER — Other Ambulatory Visit (INDEPENDENT_AMBULATORY_CARE_PROVIDER_SITE_OTHER): Payer: Self-pay | Admitting: *Deleted

## 2019-12-03 DIAGNOSIS — Z1211 Encounter for screening for malignant neoplasm of colon: Secondary | ICD-10-CM

## 2019-12-22 ENCOUNTER — Encounter (INDEPENDENT_AMBULATORY_CARE_PROVIDER_SITE_OTHER): Payer: Self-pay | Admitting: *Deleted

## 2019-12-22 ENCOUNTER — Telehealth (INDEPENDENT_AMBULATORY_CARE_PROVIDER_SITE_OTHER): Payer: Self-pay | Admitting: *Deleted

## 2019-12-22 NOTE — Telephone Encounter (Signed)
Patient needs [place] (copay card) ° °

## 2019-12-23 MED ORDER — SUTAB 1479-225-188 MG PO TABS
1.0000 | ORAL_TABLET | Freq: Once | ORAL | 0 refills | Status: AC
Start: 2019-12-23 — End: 2019-12-23

## 2020-01-11 ENCOUNTER — Telehealth (INDEPENDENT_AMBULATORY_CARE_PROVIDER_SITE_OTHER): Payer: Self-pay | Admitting: *Deleted

## 2020-01-11 NOTE — Telephone Encounter (Signed)
Referring MD/PCP: fusco   Procedure: tcs  Reason/Indication:  screening  Has patient had this procedure before?  no  If so, when, by whom and where?    Is there a family history of colon cancer?  no  Who?  What age when diagnosed?    Is patient diabetic?   no      Does patient have prosthetic heart valve or mechanical valve?  no  Do you have a pacemaker/defibrillator?  no  Has patient ever had endocarditis/atrial fibrillation? no  Does patient use oxygen? no  Has patient had joint replacement within last 12 months?  no  Is patient constipated or do they take laxatives? no  Does patient have a history of alcohol/drug use?  no  Is patient on blood thinner such as Coumadin, Plavix and/or Aspirin? no  Medications: metoprolol 25 mg daily  Allergies: nkda  Medication Adjustment per Dr Rehman/Dr Jenetta Downer   Procedure date & time: 02/11/20

## 2020-01-13 ENCOUNTER — Other Ambulatory Visit: Payer: Self-pay

## 2020-01-13 ENCOUNTER — Ambulatory Visit: Payer: BC Managed Care – PPO | Admitting: Pulmonary Disease

## 2020-01-13 ENCOUNTER — Encounter: Payer: Self-pay | Admitting: Pulmonary Disease

## 2020-01-13 VITALS — BP 128/86 | HR 95 | Temp 98.0°F | Ht 72.0 in | Wt 180.8 lb

## 2020-01-13 DIAGNOSIS — R9389 Abnormal findings on diagnostic imaging of other specified body structures: Secondary | ICD-10-CM | POA: Diagnosis not present

## 2020-01-13 DIAGNOSIS — J189 Pneumonia, unspecified organism: Secondary | ICD-10-CM | POA: Diagnosis not present

## 2020-01-13 NOTE — Assessment & Plan Note (Signed)
Right lower lobe patchy infiltrates noted 10/2010 and again 10/2019 Surprisingly this was not present on CT angiogram 04/2010.  It is possible that these infiltrates at the same that were noted in 2012 or perhaps represents a recurrent insult.  Differential includes subclinical aspiration, less likely atypical infection or severe reflux.  He does admit to sinus drainage and reflux symptoms He is not overly symptomatic for this so we will try conservative approach .  I doubt ILD, he does not seem to have any stigmata of collagen vascular disease  We will repeat high-resolution CT chest in 6 months but I doubt that any intervention will be necessary

## 2020-01-13 NOTE — Patient Instructions (Signed)
  Faint patchy infiltrate in both lower lobes  -which was also noted in June 2012 CT scan -May be related to old resolved inflammation  Recommend high-resolution CT chest in 6 months March 2022

## 2020-01-13 NOTE — Progress Notes (Signed)
Subjective:    Patient ID: Stanley Sharp, male    DOB: [birth year], 59 y.o.   MRN: [id number]  HPI  59 year old never smoker presents for evaluation of abnormal imaging studies, CT chest.  He is accompanied by his wife Timmothy Sours who used to work in our office.  He works as a Geologist, engineering.  He reports a severe episode of bronchitis in January 2020 which required treatment with antibiotics and steroids and wonders if he may have had Covid.  He had intentional weight loss of about 10 pounds but then was not able to regain this weight back. PCP ordered CT chest and abdomen as a screening study for weight loss, CT chest showed faint groundglass infiltrates bilateral, to my independent review these are faint GGO especially in the right lower lobe. Hence he was referred.  He denies preceding URI, bronchitis, sinus infection.  He denies dysphagia .  He does have frequent sinus drainage for which he uses Zyrtec.  He reports significant reflux for which she uses over-the-counter medication.  On review of prior imaging's, CT chest from 10/2010 shows right lower lobe patchy infiltrates, surprisingly this was not present on a prior CT from 04/2010  He has been able to regain his weight, denies skin rash or arthritis  PMH -prostate cancer, low-grade, under surveillance, PVCs on metoprolol  Significant tests/ events reviewed CT chest 10/2019 faint bilateral lower lobe groundglass infiltrates  CT chest 10/2010 minimal patchy groundglass infiltrates right lower lobe  CT angiogram chest 04/2010 no infiltrates, 4 mm nodule and 3 mm nodule right upper lobe     Past Medical History:  Diagnosis Date  . Kidney stones   . No significant past medical history   . Prostate cancer [facility])     Past Surgical History:  Procedure Laterality Date  . CERVICAL SPINE SURGERY      No Known Allergies  Social History   Socioeconomic History  . Marital status: Married    Spouse name: Not on file  .  Number of children: Not on file  . Years of education: Not on file  . Highest education level: Not on file  Occupational History  . Not on file  Tobacco Use  . Smoking status: Never Smoker  . Smokeless tobacco: Never Used  Vaping Use  . Vaping Use: Never used  Substance and Sexual Activity  . Alcohol use: No  . Drug use: No  . Sexual activity: Not on file  Other Topics Concern  . Not on file  Social History Narrative  . Not on file   Social Determinants of Health   Financial Resource Strain:   . Difficulty of Paying Living Expenses: Not on file  Food Insecurity:   . Worried About Charity fundraiser in the Last Year: Not on file  . Ran Out of Food in the Last Year: Not on file  Transportation Needs:   . Lack of Transportation (Medical): Not on file  . Lack of Transportation (Non-Medical): Not on file  Physical Activity:   . Days of Exercise per Week: Not on file  . Minutes of Exercise per Session: Not on file  Stress:   . Feeling of Stress : Not on file  Social Connections:   . Frequency of Communication with Friends and Family: Not on file  . Frequency of Social Gatherings with Friends and Family: Not on file  . Attends Religious Services: Not on file  . Active Member of Clubs or Organizations: Not  on file  . Attends Archivist Meetings: Not on file  . Marital Status: Not on file  Intimate Partner Violence:   . Fear of Current or Ex-Partner: Not on file  . Emotionally Abused: Not on file  . Physically Abused: Not on file  . Sexually Abused: Not on file    Family History  Problem Relation Age of Onset  . Diabetes Mother   . Hyperlipidemia Father   . Stroke Father   . Heart Problems Maternal Grandmother   . Heart attack Maternal Grandfather       Review of Systems Weight change as above Nasal congestion  Constitutional: negative for anorexia, fevers and sweats  Eyes: negative for irritation, redness and visual disturbance  Ears, nose, mouth,  throat, and face: negative for earaches, epistaxis and sore throat  Respiratory: negative for cough, dyspnea on exertion, sputum and wheezing  Cardiovascular: negative for chest pain, dyspnea, lower extremity edema, orthopnea, palpitations and syncope  Gastrointestinal: negative for abdominal pain, constipation, diarrhea, melena, nausea and vomiting  Genitourinary:negative for dysuria, frequency and hematuria  Hematologic/lymphatic: negative for bleeding, easy bruising and lymphadenopathy  Musculoskeletal:negative for arthralgias, muscle weakness and stiff joints  Neurological: negative for coordination problems, gait problems, headaches and weakness  Endocrine: negative for diabetic symptoms including polydipsia, polyuria and weight loss     Objective:   Physical Exam  Gen. Pleasant, well-nourished, in no distress, normal affect ENT - no pallor,icterus, no post nasal drip Neck: No JVD, no thyromegaly, no carotid bruits Lungs: no use of accessory muscles, no dullness to percussion, clear without rales or rhonchi  Cardiovascular: Rhythm regular, heart sounds  normal, no murmurs or gallops, no peripheral edema Abdomen: soft and non-tender, no hepatosplenomegaly, BS normal. Musculoskeletal: No deformities, no cyanosis or clubbing, changes of osteoarthritis and DIP joints Neuro:  alert, non focal       Assessment & Plan:

## 2020-01-21 DIAGNOSIS — C61 Malignant neoplasm of prostate: Secondary | ICD-10-CM | POA: Diagnosis not present

## 2020-02-05 ENCOUNTER — Other Ambulatory Visit: Payer: Self-pay | Admitting: Cardiology

## 2020-02-05 DIAGNOSIS — C61 Malignant neoplasm of prostate: Secondary | ICD-10-CM | POA: Diagnosis not present

## 2020-02-05 DIAGNOSIS — R002 Palpitations: Secondary | ICD-10-CM

## 2020-02-09 ENCOUNTER — Other Ambulatory Visit: Payer: Self-pay

## 2020-02-09 ENCOUNTER — Other Ambulatory Visit (HOSPITAL_COMMUNITY)
Admission: RE | Admit: 2020-02-09 | Discharge: 2020-02-09 | Disposition: A | Discharge status: Home / Self Care | Payer: BC Managed Care – PPO | Source: Ambulatory Visit | Attending: Internal Medicine | Admitting: Internal Medicine

## 2020-02-09 DIAGNOSIS — Z20822 Contact with and (suspected) exposure to covid-19: Secondary | ICD-10-CM | POA: Diagnosis not present

## 2020-02-09 DIAGNOSIS — Z01812 Encounter for preprocedural laboratory examination: Secondary | ICD-10-CM | POA: Insufficient documentation

## 2020-02-10 LAB — SARS CORONAVIRUS 2 (TAT 6-24 HRS): SARS Coronavirus 2: NEGATIVE

## 2020-02-11 ENCOUNTER — Encounter (HOSPITAL_COMMUNITY)
Admission: RE | Disposition: A | Discharge status: Home / Self Care | Payer: Self-pay | Source: Home / Self Care | Attending: Internal Medicine

## 2020-02-11 ENCOUNTER — Encounter (HOSPITAL_COMMUNITY): Payer: Self-pay | Admitting: Internal Medicine

## 2020-02-11 ENCOUNTER — Other Ambulatory Visit: Payer: Self-pay

## 2020-02-11 ENCOUNTER — Ambulatory Visit (HOSPITAL_COMMUNITY)
Admission: RE | Admit: 2020-02-11 | Discharge: 2020-02-11 | Disposition: A | Discharge status: Home / Self Care | Payer: BC Managed Care – PPO | Attending: Internal Medicine | Admitting: Internal Medicine

## 2020-02-11 DIAGNOSIS — Z1211 Encounter for screening for malignant neoplasm of colon: Secondary | ICD-10-CM | POA: Diagnosis not present

## 2020-02-11 DIAGNOSIS — Z79899 Other long term (current) drug therapy: Secondary | ICD-10-CM | POA: Insufficient documentation

## 2020-02-11 DIAGNOSIS — Z8546 Personal history of malignant neoplasm of prostate: Secondary | ICD-10-CM | POA: Diagnosis not present

## 2020-02-11 HISTORY — PX: COLONOSCOPY: SHX5424

## 2020-02-11 SURGERY — COLONOSCOPY
Anesthesia: Moderate Sedation

## 2020-02-11 MED ORDER — MEPERIDINE HCL 50 MG/ML IJ SOLN
INTRAMUSCULAR | Status: AC
  Filled 2020-02-11: qty 1

## 2020-02-11 MED ORDER — MEPERIDINE HCL 50 MG/ML IJ SOLN
INTRAMUSCULAR | Status: DC | PRN
Start: 2020-02-11 — End: 2020-02-11
  Administered 2020-02-11 (×2): 25 mg

## 2020-02-11 MED ORDER — MIDAZOLAM HCL 5 MG/5ML IJ SOLN
INTRAMUSCULAR | Status: DC | PRN
  Administered 2020-02-11 (×4): 2 mg via INTRAVENOUS

## 2020-02-11 MED ORDER — MIDAZOLAM HCL 5 MG/5ML IJ SOLN
INTRAMUSCULAR | Status: AC
  Filled 2020-02-11: qty 10

## 2020-02-11 MED ORDER — STERILE WATER FOR IRRIGATION IR SOLN
Status: DC | PRN
  Administered 2020-02-11: 1.5 mL

## 2020-02-11 MED ORDER — SODIUM CHLORIDE 0.9 % IV SOLN: INTRAVENOUS | Status: DC

## 2020-02-11 NOTE — Progress Notes (Signed)
Stanley Sharp may return to work on Monday 02/15/2020.  He had a procedure at the hospital on 02/11/2020.

## 2020-02-11 NOTE — Progress Notes (Signed)
Stanley Sharp can return to work tomorrow 02/12/2020 after 9:00 AM due to having a procedure that required sedation on 02/11/2020.

## 2020-02-11 NOTE — H&P (Signed)
Stanley Sharp is an 59 y.o. male.   Chief Complaint: Patient is here for colonoscopy. HPI: Patient is 59 year old Caucasian male who is here for screening colonoscopy.  This is patient's first exam.  He denies abdominal pain change in bowel habits or rectal bleeding. Family history is negative for CRC. He takes metoprolol for PVCs.  Past Medical History:  Diagnosis Date   Kidney stones    No significant past medical history    Prostate cancer ([facility])         History of PVCs.  Past Surgical History:  Procedure Laterality Date   CERVICAL SPINE SURGERY      Family History  Problem Relation Age of Onset   Diabetes Mother    Hyperlipidemia Father    Stroke Father    Heart Problems Maternal Grandmother    Heart attack Maternal Grandfather    Social History:  reports that he has never smoked. He has never used smokeless tobacco. He reports that he does not drink alcohol and does not use drugs.  Allergies: No Known Allergies  Medications Prior to Admission  Medication Sig Dispense Refill   Ascorbic Acid (VITAMIN C) 100 MG tablet Take 500 mg by mouth 3 (three) times daily.      Cholecalciferol (VITAMIN D-3) 125 MCG (5000 UT) TABS Take 10,000 Units by mouth daily.      Menatetrenone (VITAMIN K2) 100 MCG TABS Take 100 mcg by mouth daily.     metoprolol succinate (TOPROL-XL) 25 MG 24 hr tablet Take 1 tablet (25 mg total) by mouth daily. Pt needs to make appt with provider for further refills 90 tablet 0   Omega-3 1000 MG CAPS Take 1,000 mg by mouth 3 (three) times daily.      Saw Palmetto, Serenoa repens, 450 MG CAPS Take 450 mg by mouth 3 (three) times daily.       Results for orders placed or performed during the hospital encounter of 02/09/20 (from the past 48 hour(s))  SARS CORONAVIRUS 2 (TAT 6-24 HRS) Nasopharyngeal Nasopharyngeal Swab     Status: None   Collection Time: 02/09/20  3:45 PM   Specimen: Nasopharyngeal Swab  Result Value Ref Range   SARS Coronavirus 2  NEGATIVE NEGATIVE    Comment: (NOTE) SARS-CoV-2 target nucleic acids are NOT DETECTED.  The SARS-CoV-2 RNA is generally detectable in upper and lower respiratory specimens during the acute phase of infection. Negative results do not preclude SARS-CoV-2 infection, do not rule out co-infections with other pathogens, and should not be used as the sole basis for treatment or other patient management decisions. Negative results must be combined with clinical observations, patient history, and epidemiological information. The expected result is Negative.  Fact Sheet for Patients: SugarRoll.be  Fact Sheet for Healthcare Providers: https://www.woods-mathews.com/  This test is not yet approved or cleared by the [place] FDA and  has been authorized for detection and/or diagnosis of SARS-CoV-2 by FDA under an Emergency Use Authorization (EUA). This EUA will remain  in effect (meaning this test can be used) for the duration of the COVID-19 declaration under Se ction 564(b)(1) of the Act, 21 U.S.C. section 360bbb-3(b)(1), unless the authorization is terminated or revoked sooner.  Performed at [facility], [place] [address], [place], [place] [address]    No results found.  Review of Systems  Blood pressure (!) 142/72, pulse 79, temperature 98.4 F (36.9 C), temperature source Oral, resp. rate 18, height 6' (1.829 m), weight 76.7 kg, SpO2 100 %.  Physical Exam HENT:     Mouth/Throat:     Mouth: Mucous membranes are moist.     Pharynx: Oropharynx is clear.  Eyes:     General: No scleral icterus.    Conjunctiva/sclera: Conjunctivae normal.  Cardiovascular:     Rate and Rhythm: Normal rate and regular rhythm.     Heart sounds: Normal heart sounds. No murmur heard.   Pulmonary:     Effort: Pulmonary effort is normal.     Breath sounds: Normal breath sounds.  Abdominal:     General: Abdomen is flat. There is no distension.      Palpations: There is no mass.     Tenderness: There is no abdominal tenderness.  Musculoskeletal:        General: No swelling.     Cervical back: Neck supple.  Lymphadenopathy:     Cervical: No cervical adenopathy.  Skin:    General: Skin is warm and dry.  Neurological:     Mental Status: He is alert.      Assessment/Plan Average risk screening colonoscopy. This is patient's first exam.  Hildred Laser, MD 02/11/2020, 8:41 AM

## 2020-02-11 NOTE — Op Note (Signed)
[facility] Patient Name: Stanley Sharp Procedure Date: 02/11/2020 8:24 AM MRN: [id number] Date of Birth: 27-Jun-1960 Attending MD: Hildred Laser , MD CSN: [id number] Age: 59 Admit Type: Outpatient Procedure:                Colonoscopy Indications:              Screening for colorectal malignant neoplasm Providers:                Hildred Laser, MD, Janeece Riggers, RN, Lambert Mody, Casimer Bilis, Technician, Thomas Hoff., Technician Referring MD:             Redmond School, MD Medicines:                Meperidine 50 mg IV, Midazolam 8 mg IV Complications:            No immediate complications. Estimated Blood Loss:     Estimated blood loss: none. Procedure:                Pre-Anesthesia Assessment:                           - Prior to the procedure, a History and Physical                            was performed, and patient medications and                            allergies were reviewed. The patient's tolerance of                            previous anesthesia was also reviewed. The risks                            and benefits of the procedure and the sedation                            options and risks were discussed with the patient.                            All questions were answered, and informed consent                            was obtained. Prior Anticoagulants: The patient has                            taken no previous anticoagulant or antiplatelet                            agents. ASA Grade Assessment: II - A patient with  mild systemic disease. After reviewing the risks                            and benefits, the patient was deemed in                            satisfactory condition to undergo the procedure.                           After obtaining informed consent, the colonoscope                            was passed under direct vision. Throughout the                             procedure, the patient's blood pressure, pulse, and                            oxygen saturations were monitored continuously. The                            [id number]-[id number] ([id number]) scope was introduced through                            the anus and advanced to the the cecum, identified                            by appendiceal orifice and ileocecal valve. The                            colonoscopy was performed without difficulty. The                            patient tolerated the procedure well. The quality                            of the bowel preparation was good except the                            ascending colon was fair. The ileocecal valve,                            appendiceal orifice, and rectum were photographed. Scope In: 8:53:42 AM Scope Out: 9:08:40 AM Scope Withdrawal Time: 0 hours 10 minutes 44 seconds  Total Procedure Duration: 0 hours 14 minutes 58 seconds  Findings:      The perianal and digital rectal examinations were normal.      The colon (entire examined portion) appeared normal.      The retroflexed view of the distal rectum and anal verge was normal and       showed no anal or rectal abnormalities. Impression:               - The entire examined colon is normal.                           -  No specimens collected.                           comment: prep in ascending colon fair requiring                            vigorous washing. Moderate Sedation:      Moderate (conscious) sedation was administered by the endoscopy nurse       and supervised by the endoscopist. The following parameters were       monitored: oxygen saturation, heart rate, blood pressure, CO2       capnography and response to care. Total physician intraservice time was       24 minutes. Recommendation:           - Patient has a contact number available for                            emergencies. The signs and symptoms of potential                            delayed  complications were discussed with the                            patient. Return to normal activities tomorrow.                            Written discharge instructions were provided to the                            patient.                           - Resume previous diet today.                           - Continue present medications.                           - Repeat colonoscopy in 10 years for screening                            purposes. Procedure Code(s):        --- Professional ---                           [phone], Colonoscopy, flexible; diagnostic, including                            collection of specimen(s) by brushing or washing,                            when performed (separate procedure)                           99153, Moderate sedation; each additional 15  minutes intraservice time                           G0500, Moderate sedation services provided by the                            same physician or other qualified health care                            professional performing a gastrointestinal                            endoscopic service that sedation supports,                            requiring the presence of an independent trained                            observer to assist in the monitoring of the                            patient's level of consciousness and physiological                            status; initial 15 minutes of intra-service time;                            patient age 8 years or older (additional time may                            be reported with [phone], as appropriate) Diagnosis Code(s):        --- Professional ---                           Z12.11, Encounter for screening for malignant                            neoplasm of colon CPT copyright 2019 American Medical Association. All rights reserved. The codes documented in this report are preliminary and upon coder review may  be revised to meet current  compliance requirements. Hildred Laser, MD Hildred Laser, MD 02/11/2020 9:15:30 AM This report has been signed electronically. Number of Addenda: 0

## 2020-02-11 NOTE — Discharge Instructions (Signed)
Colonoscopy, Adult, Care After This sheet gives you information about how to care for yourself after your procedure. Your health care provider may also give you more specific instructions. If you have problems or questions, contact your health care provider. What can I expect after the procedure? After the procedure, it is common to have:  A small amount of blood in your stool for 24 hours after the procedure.  Some gas.  Mild cramping or bloating of your abdomen. Follow these instructions at home: Eating and drinking   Drink enough fluid to keep your urine pale yellow.  Follow instructions from your health care provider about eating or drinking restrictions.  Resume your normal diet as instructed by your health care provider. Avoid heavy or fried foods that are hard to digest. Activity  Rest as told by your health care provider.  Avoid sitting for a long time without moving. Get up to take short walks every 1-2 hours. This is important to improve blood flow and breathing. Ask for help if you feel weak or unsteady.  Return to your normal activities as told by your health care provider. Ask your health care provider what activities are safe for you. Managing cramping and bloating   Try walking around when you have cramps or feel bloated.  Apply heat to your abdomen as told by your health care provider. Use the heat source that your health care provider recommends, such as a moist heat pack or a heating pad. ? Place a towel between your skin and the heat source. ? Leave the heat on for 20-30 minutes. ? Remove the heat if your skin turns bright red. This is especially important if you are unable to feel pain, heat, or cold. You may have a greater risk of getting burned. General instructions  For the first 24 hours after the procedure: ? Do not drive or use machinery. ? Do not sign important documents. ? Do not drink alcohol. ? Do your regular daily activities at a slower pace  than normal. ? Eat soft foods that are easy to digest.  Take over-the-counter and prescription medicines only as told by your health care provider.  Keep all follow-up visits as told by your health care provider. This is important. Contact a health care provider if:  You have blood in your stool 2-3 days after the procedure. Get help right away if you have:  More than a small spotting of blood in your stool.  Large blood clots in your stool.  Swelling of your abdomen.  Nausea or vomiting.  A fever.  Increasing pain in your abdomen that is not relieved with medicine. Summary  After the procedure, it is common to have a small amount of blood in your stool. You may also have mild cramping and bloating of your abdomen.  For the first 24 hours after the procedure, do not drive or use machinery, sign important documents, or drink alcohol.  Get help right away if you have a lot of blood in your stool, nausea or vomiting, a fever, or increased pain in your abdomen. This information is not intended to replace advice given to you by your health care provider. Make sure you discuss any questions you have with your health care provider. Document Revised: 12/01/2018 Document Reviewed: 12/01/2018 Elsevier Patient Education  [place] usual medications and diet as before. No driving for 24 hours. Next screening exam in 10 years

## 2020-02-17 ENCOUNTER — Encounter (HOSPITAL_COMMUNITY): Payer: Self-pay | Admitting: Internal Medicine

## 2020-02-18 DIAGNOSIS — L821 Other seborrheic keratosis: Secondary | ICD-10-CM | POA: Diagnosis not present

## 2020-02-18 DIAGNOSIS — D225 Melanocytic nevi of trunk: Secondary | ICD-10-CM | POA: Diagnosis not present

## 2020-02-18 DIAGNOSIS — D485 Neoplasm of uncertain behavior of skin: Secondary | ICD-10-CM | POA: Diagnosis not present

## 2020-02-18 DIAGNOSIS — L720 Epidermal cyst: Secondary | ICD-10-CM | POA: Diagnosis not present

## 2020-02-18 DIAGNOSIS — Z1283 Encounter for screening for malignant neoplasm of skin: Secondary | ICD-10-CM | POA: Diagnosis not present

## 2020-03-09 ENCOUNTER — Ambulatory Visit: Payer: BC Managed Care – PPO | Admitting: Cardiology

## 2020-03-10 ENCOUNTER — Ambulatory Visit: Payer: BC Managed Care – PPO | Admitting: Cardiology

## 2020-03-10 ENCOUNTER — Other Ambulatory Visit: Payer: Self-pay

## 2020-03-10 ENCOUNTER — Encounter: Payer: Self-pay | Admitting: Cardiology

## 2020-03-10 VITALS — BP 110/60 | HR 69 | Ht 72.0 in | Wt 186.0 lb

## 2020-03-10 DIAGNOSIS — I493 Ventricular premature depolarization: Secondary | ICD-10-CM

## 2020-03-10 DIAGNOSIS — R002 Palpitations: Secondary | ICD-10-CM

## 2020-03-10 NOTE — Patient Instructions (Signed)
Medication Instructions:  The current medical regimen is effective;  continue present plan and medications.  *If you need a refill on your cardiac medications before your next appointment, please call your pharmacy*  Follow-Up: At [facility], you and your health needs are our priority.  As part of our continuing mission to provide you with exceptional heart care, we have created designated Provider Care Teams.  These Care Teams include your primary Cardiologist (physician) and Advanced Practice Providers (APPs -  Physician Assistants and Nurse Practitioners) who all work together to provide you with the care you need, when you need it.  We recommend signing up for the patient portal called "MyChart".  Sign up information is provided on this After Visit Summary.  MyChart is used to connect with patients for Virtual Visits (Telemedicine).  Patients are able to view lab/test results, encounter notes, upcoming appointments, etc.  Non-urgent messages can be sent to your provider as well.   To learn more about what you can do with MyChart, go to [email or web address].    Your next appointment:   12 month(s)  The format for your next appointment:   In Person  Provider:   Mark Skains, MD   Thank you for choosing [facility]!!      

## 2020-03-10 NOTE — Progress Notes (Signed)
Cardiology Office Note:    Date:  03/10/2020   ID:  Stanley Sharp, DOB [birth year], MRN [id number]  PCP:  Redmond School, MD  [facility] Cardiologist:  Candee Furbish, MD  [place] Electrophysiologist:  None   Referring MD: Redmond School, MD   No chief complaint on file.   History of Present Illness:    Stanley Sharp is a 59 y.o. male here for the follow-up of palpitations PVCs prior tachycardia.  His wife Stanley Sharp works in the heart failure clinic.  Had superficial Vein thrombosis treated with warm compresses.  PVCs have been noted with normal echocardiogram in the past.  Had tried metoprolol and diltiazem.  He is off of both medications and doing well.  Past Medical History:  Diagnosis Date  . Kidney stones   . No significant past medical history   . Prostate cancer [facility])     Past Surgical History:  Procedure Laterality Date  . CERVICAL SPINE SURGERY    . COLONOSCOPY N/A 02/11/2020   Procedure: COLONOSCOPY;  Surgeon: Rogene Houston, MD;  Location: [facility]O SU[facility];  Service: Endoscopy;  Laterality: N/A;  830    Current Medications: Current Meds  Medication Sig  . Ascorbic Acid (VITAMIN C) 100 MG tablet Take 500 mg by mouth 3 (three) times daily.   . Cholecalciferol (VITAMIN D-3) 125 MCG (5000 UT) TABS Take 10,000 Units by mouth daily.   . Menatetrenone (VITAMIN K2) 100 MCG TABS Take 100 mcg by mouth daily.  . metoprolol succinate (TOPROL-XL) 25 MG 24 hr tablet Take 1 tablet (25 mg total) by mouth daily. Pt needs to make appt with provider for further refills  . Omega-3 1000 MG CAPS Take 1,000 mg by mouth 3 (three) times daily.   . Saw Palmetto, Serenoa repens, 450 MG CAPS Take 450 mg by mouth 3 (three) times daily.      Allergies:   Patient has no known allergies.   Social History   Socioeconomic History  . Marital status: Married    Spouse name: Not on file  . Number of children: Not on file  . Years of education: Not on file  . Highest education level: Not on  file  Occupational History  . Not on file  Tobacco Use  . Smoking status: Never Smoker  . Smokeless tobacco: Never Used  Vaping Use  . Vaping Use: Never used  Substance and Sexual Activity  . Alcohol use: No  . Drug use: No  . Sexual activity: Not on file  Other Topics Concern  . Not on file  Social History Narrative  . Not on file   Social Determinants of Health   Financial Resource Strain:   . Difficulty of Paying Living Expenses: Not on file  Food Insecurity:   . Worried About Charity fundraiser in the Last Year: Not on file  . Ran Out of Food in the Last Year: Not on file  Transportation Needs:   . Lack of Transportation (Medical): Not on file  . Lack of Transportation (Non-Medical): Not on file  Physical Activity:   . Days of Exercise per Week: Not on file  . Minutes of Exercise per Session: Not on file  Stress:   . Feeling of Stress : Not on file  Social Connections:   . Frequency of Communication with Friends and Family: Not on file  . Frequency of Social Gatherings with Friends and Family: Not on file  . Attends Religious Services: Not on file  .  Active Member of Clubs or Organizations: Not on file  . Attends Archivist Meetings: Not on file  . Marital Status: Not on file     Family History: The patient's family history includes Diabetes in his mother; Heart Problems in his maternal grandmother; Heart attack in his maternal grandfather; Hyperlipidemia in his father; Stroke in his father.  ROS:   Please see the history of present illness.    No chest pain fevers chills nausea vomiting syncope all other systems reviewed and are negative.  EKGs/Labs/Other Studies Reviewed:    The following studies were reviewed today:  Echo 2012-normal EF  EKG:  EKG is  ordered today.  The ekg ordered today demonstrates sinus rhythm sixty-nine  Recent Labs: No results found for requested labs within last 8760 hours.  Recent Lipid Panel No results found for:  CHOL, TRIG, HDL, CHOLHDL, VLDL, LDLCALC, LDLDIRECT   Risk Assessment/Calculations:       Physical Exam:    VS:  BP 110/60   Pulse 69   Ht 6' (1.829 m)   Wt 186 lb (84.4 kg)   SpO2 94%   BMI 25.23 kg/m     Wt Readings from Last 3 Encounters:  03/10/20 186 lb (84.4 kg)  02/11/20 169 lb (76.7 kg)  01/13/20 180 lb 12.8 oz (82 kg)     GEN:  Well nourished, well developed in no acute distress HEENT: Normal NECK: No JVD; No carotid bruits LYMPHATICS: No lymphadenopathy CARDIAC: RRR, no murmurs, rubs, gallops RESPIRATORY:  Clear to auscultation without rales, wheezing or rhonchi  ABDOMEN: Soft, non-tender, non-distended MUSCULOSKELETAL:  No edema; No deformity  SKIN: Warm and dry NEUROLOGIC:  Alert and oriented x 3 PSYCHIATRIC:  Normal affect   ASSESSMENT:    1. Palpitations   2. PVC (premature ventricular contraction)    PLAN:    In order of problems listed above:  PVCs/tachycardia -Toprol 25 mg can be taken daily.  Doing well.  No PVCs on EKG today. --Decreased caffeine and palpitations were helped significantly.    Back pain has been resolved since Dr. Ronnald Ramp operated.  Feels much better.   Medication Adjustments/Labs and Tests Ordered: Current medicines are reviewed at length with the patient today.  Concerns regarding medicines are outlined above.  Orders Placed This Encounter  Procedures  . EKG 12-Lead   No orders of the defined types were placed in this encounter.   Patient Instructions  Medication Instructions:  The current medical regimen is effective;  continue present plan and medications.  *If you need a refill on your cardiac medications before your next appointment, please call your pharmacy*  Follow-Up: At [facility], you and your health needs are our priority.  As part of our continuing mission to provide you with exceptional heart care, we have created designated Provider Care Teams.  These Care Teams include your primary Cardiologist  (physician) and Advanced Practice Providers (APPs -  Physician Assistants and Nurse Practitioners) who all work together to provide you with the care you need, when you need it.  We recommend signing up for the patient portal called "MyChart".  Sign up information is provided on this After Visit Summary.  MyChart is used to connect with patients for Virtual Visits (Telemedicine).  Patients are able to view lab/test results, encounter notes, upcoming appointments, etc.  Non-urgent messages can be sent to your provider as well.   To learn more about what you can do with MyChart, go to [email or web address].    Your  next appointment:   12 month(s)  The format for your next appointment:   In Person  Provider:   Candee Furbish, MD   Thank you for choosing [facility]!!         Signed, Candee Furbish, MD  03/10/2020 3:56 PM    [place]

## 2020-03-11 ENCOUNTER — Other Ambulatory Visit: Payer: Self-pay | Admitting: Urology

## 2020-03-11 DIAGNOSIS — C61 Malignant neoplasm of prostate: Secondary | ICD-10-CM

## 2020-05-05 ENCOUNTER — Other Ambulatory Visit: Payer: Self-pay | Admitting: Cardiology

## 2020-05-05 DIAGNOSIS — R002 Palpitations: Secondary | ICD-10-CM

## 2020-05-27 ENCOUNTER — Other Ambulatory Visit: Payer: BC Managed Care – PPO

## 2020-05-27 ENCOUNTER — Other Ambulatory Visit: Payer: Self-pay

## 2020-05-27 DIAGNOSIS — Z20822 Contact with and (suspected) exposure to covid-19: Secondary | ICD-10-CM

## 2020-05-31 LAB — NOVEL CORONAVIRUS, NAA: SARS-CoV-2, NAA: DETECTED — AB

## 2020-06-01 ENCOUNTER — Telehealth: Payer: Self-pay

## 2020-06-01 NOTE — Telephone Encounter (Signed)
Prescreened patient for monoclonal antibody infusion after recently testing positive for COVID. Patient does not qualify based off of lack of symptoms, co-morbid conditions and/or not a member of an at risk group. Provided patient with hotline number if they experiences any changes and instructed to go the the ED with any worsening respiratory concerns.   Melenie Minniear Lorita Officer, RN

## 2020-06-02 ENCOUNTER — Other Ambulatory Visit: Payer: BC Managed Care – PPO

## 2020-07-19 IMAGING — CT CT ABD-PELV W/O CM
1 of 2 series · 12 of 32 positions shown, 18 images · non-contrast
Comparison: CT abdomen pelvis dated 05/10/2009.

CLINICAL DATA: 58-year-old male with unintentional weight loss.

EXAM:
CT CHEST, ABDOMEN AND PELVIS WITHOUT CONTRAST
TECHNIQUE: Multidetector CT imaging of the chest, abdomen and pelvis was
performed following the standard protocol without IV contrast.

[Series 2: chest/abd/pelvis w/(date) · axial · 0.73mm/px · z∈[-670,-70]mm · 12 of 142 slices shown, 18 images]
[im 11/142  soft-tissue]
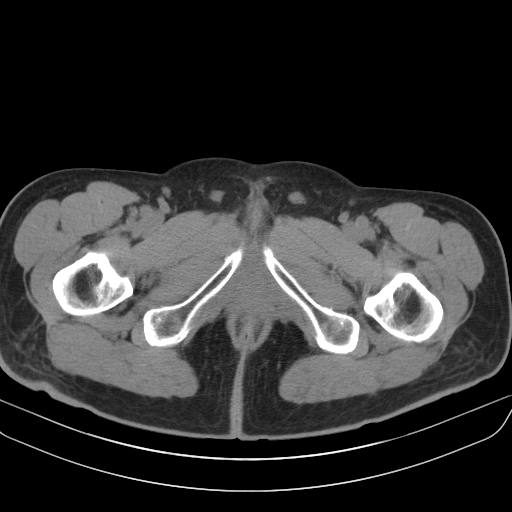
[im 11/142  bone]
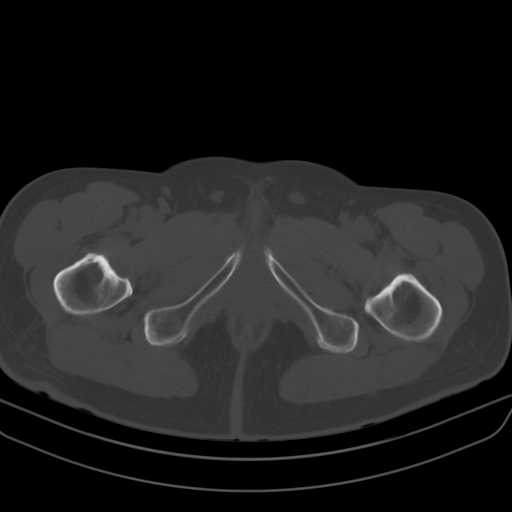
[im 22/142  soft-tissue]
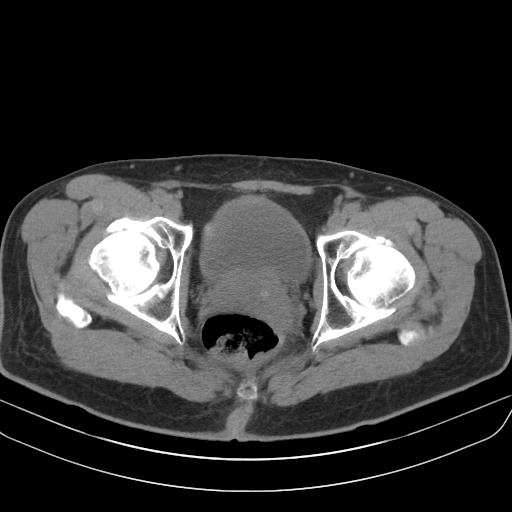
[im 33/142  soft-tissue]
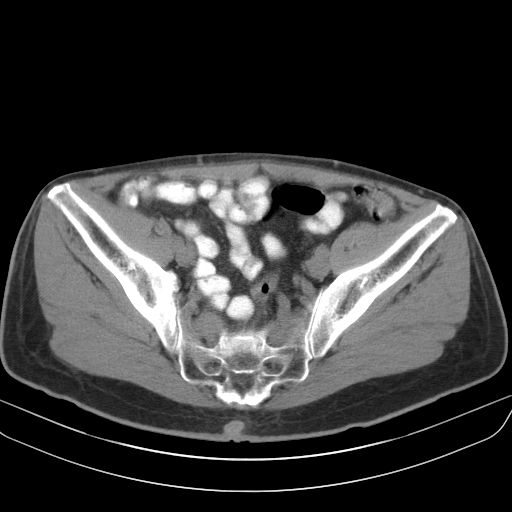
[im 44/142  soft-tissue]
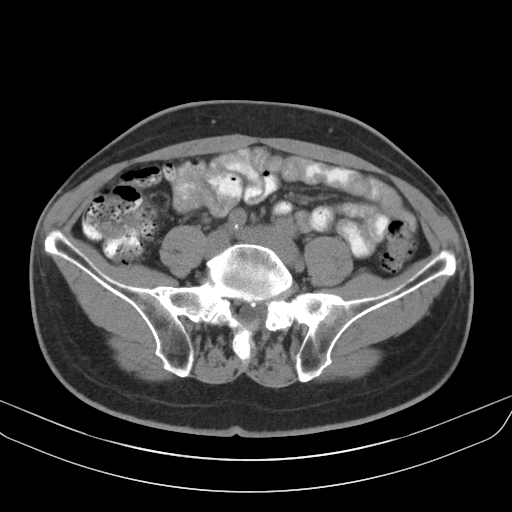
[im 55/142  soft-tissue]
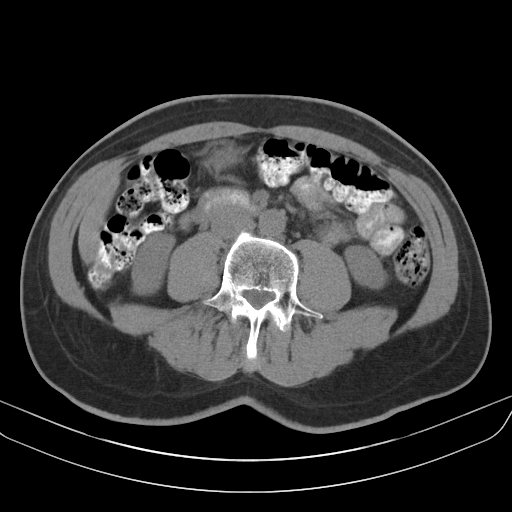
[im 66/142  soft-tissue]
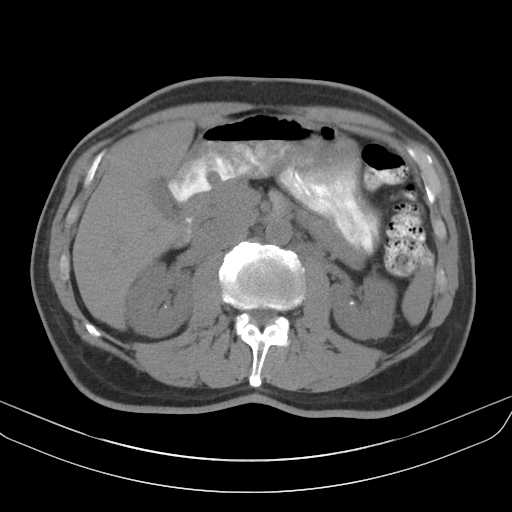
[im 76/142  soft-tissue]
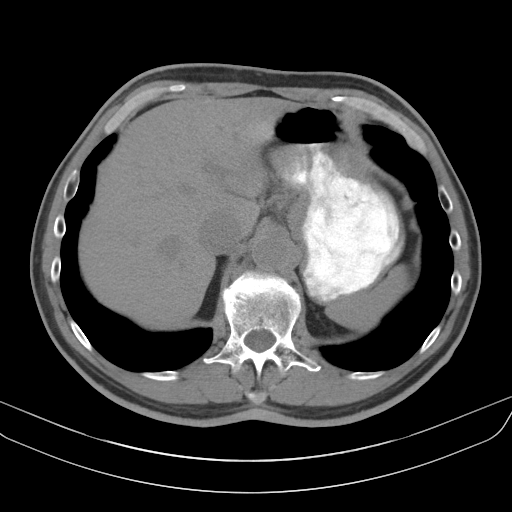
[im 87/142  soft-tissue]
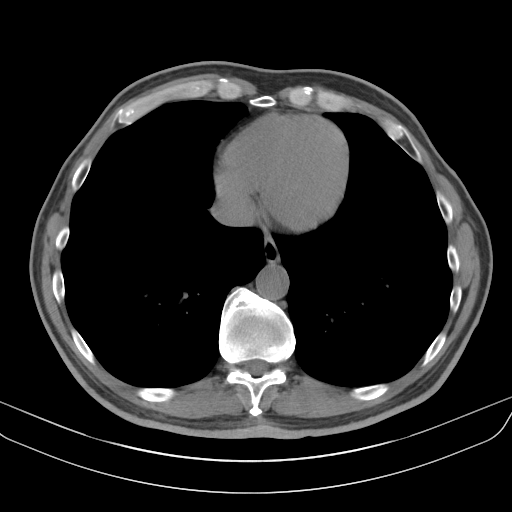
[im 98/142  soft-tissue]
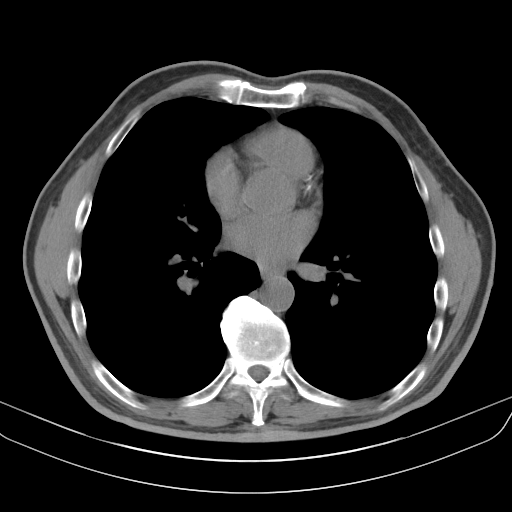
[im 98/142  lung]
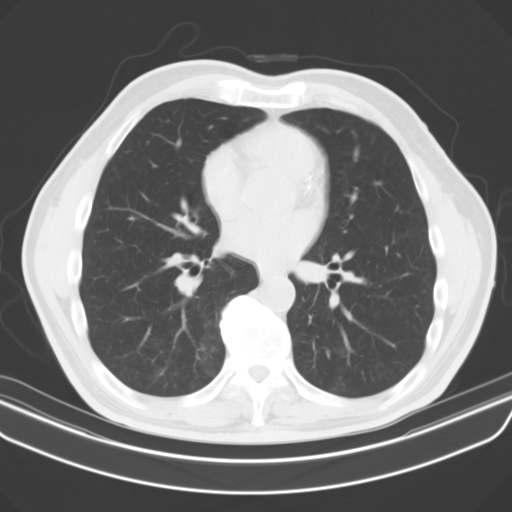
[im 98/142  bone]
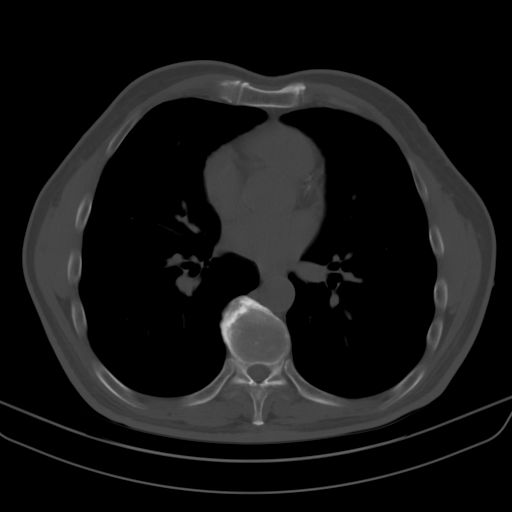
[im 109/142  soft-tissue]
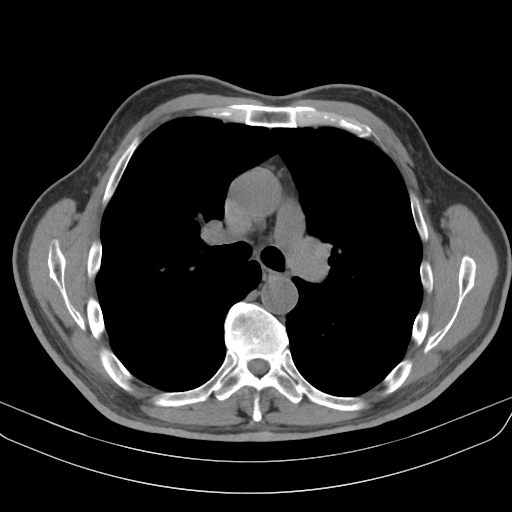
[im 109/142  lung]
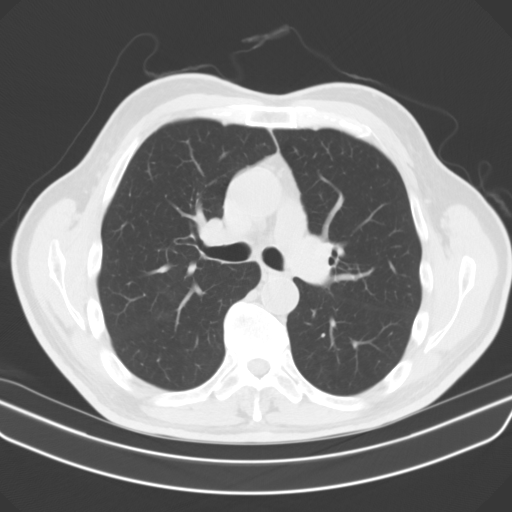
[im 120/142  soft-tissue]
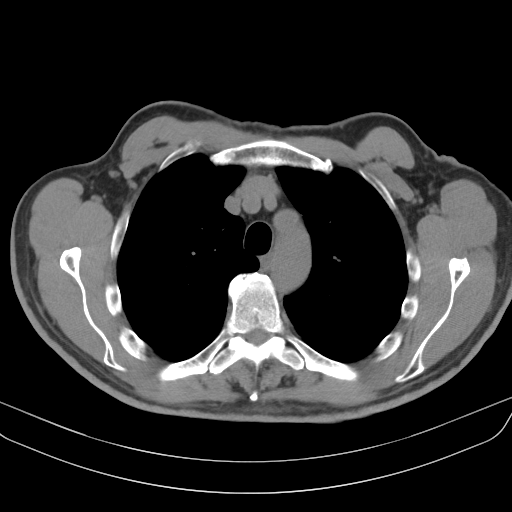
[im 120/142  lung]
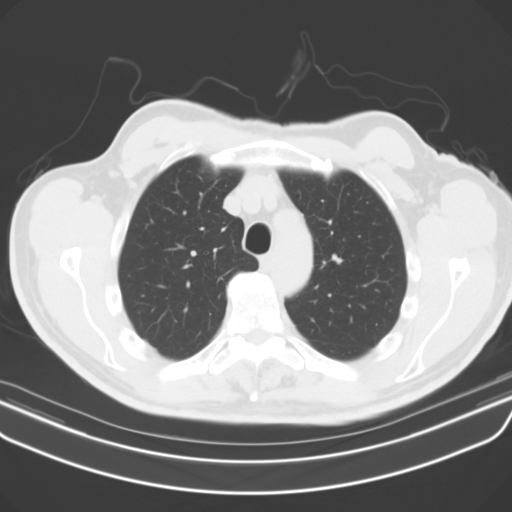
[im 131/142  soft-tissue]
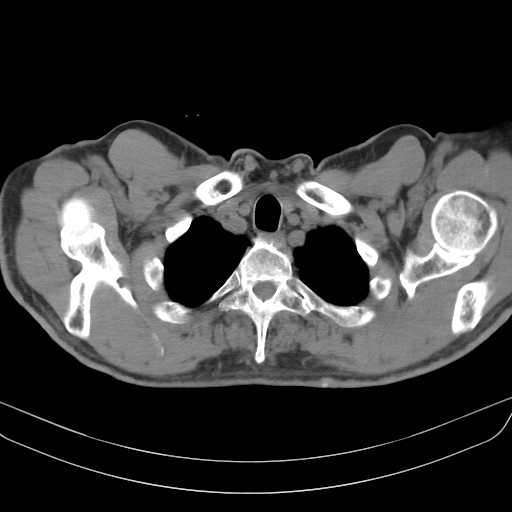
[im 131/142  lung]
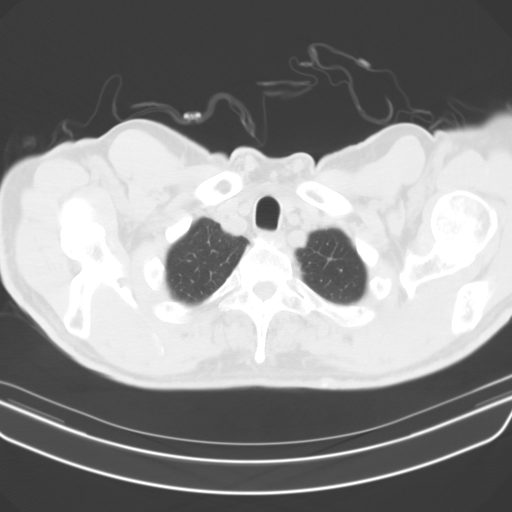

[12 of 32 positions shown; findings below may reference images not displayed]

FINDINGS: Evaluation of this exam is limited in the absence of intravenous
contrast.

CT CHEST FINDINGS

Cardiovascular: There is no cardiomegaly or pericardial effusion.
Coronary vascular calcification primarily involving the LAD. The
thoracic aorta and central pulmonary arteries are grossly
unremarkable.

Mediastinum/Nodes: No hilar or mediastinal adenopathy. The esophagus
and thyroid gland are grossly unremarkable. No mediastinal fluid
collection.

Lungs/Pleura: Faint bilateral lower lobe confluent ground-glass
densities concerning for developing infiltrate, possibly atypical.
Aspiration is not excluded. Clinical correlation and follow-up
recommended. No lobar consolidation, pleural effusion, pneumothorax.
The central airways are patent.

Musculoskeletal: Degenerative changes of the spine. Partially
visualized lower cervical ACDF. No acute osseous pathology.

CT ABDOMEN PELVIS FINDINGS

No intra-abdominal free air or free fluid.

Hepatobiliary: No focal liver abnormality is seen. No gallstones,
gallbladder wall thickening, or biliary dilatation.

Pancreas: Unremarkable. No pancreatic ductal dilatation or
surrounding inflammatory changes.

Spleen: Normal in size without focal abnormality.

Adrenals/Urinary Tract: The adrenal glands are unremarkable.
Multiple nonobstructing bilateral renal calculi. No hydronephrosis.
The visualized ureters appear unremarkable. Several small stones
noted along the posterior bladder wall likely recently passed kidney
stones.

Stomach/Bowel: There is no bowel obstruction or active inflammation.
The appendix is not visualized with certainty. No inflammatory
changes identified in the right lower quadrant.

Vascular/Lymphatic: Mild aortoiliac atherosclerotic disease. The IVC
is unremarkable. No portal venous gas. There is no adenopathy.

Reproductive: Enlarged prostate gland measuring approximately 6 cm
in transverse axial diameter. The seminal vesicles are symmetric.

Other: None

Musculoskeletal: Degenerative changes of the spine and hips. No
acute osseous pathology.
IMPRESSION: 1. Faint bilateral lower lobe confluent ground-glass densities
concerning for developing infiltrate, possibly atypical. Aspiration
is not excluded. Clinical correlation and follow-up recommended.
2. Nonobstructing bilateral renal calculi. No hydronephrosis.
Several small stones along the posterior bladder wall likely
recently passed kidney stones.
3. No bowel obstruction.
4. Enlarged prostate gland.
5. Aortic Atherosclerosis ([id number]).

## 2020-07-29 ENCOUNTER — Ambulatory Visit
Admission: RE | Admit: 2020-07-29 | Discharge: 2020-07-29 | Disposition: A | Discharge status: Home / Self Care | Payer: BC Managed Care – PPO | Source: Ambulatory Visit | Attending: Urology | Admitting: Urology

## 2020-07-29 DIAGNOSIS — R590 Localized enlarged lymph nodes: Secondary | ICD-10-CM | POA: Diagnosis not present

## 2020-07-29 DIAGNOSIS — C61 Malignant neoplasm of prostate: Secondary | ICD-10-CM

## 2020-07-29 DIAGNOSIS — N400 Enlarged prostate without lower urinary tract symptoms: Secondary | ICD-10-CM | POA: Diagnosis not present

## 2020-07-29 DIAGNOSIS — R972 Elevated prostate specific antigen [PSA]: Secondary | ICD-10-CM | POA: Diagnosis not present

## 2020-07-29 MED ORDER — GADOBENATE DIMEGLUMINE 529 MG/ML IV SOLN
17.0000 mL | Freq: Once | INTRAVENOUS | Status: AC | PRN
  Administered 2020-07-29: 17 mL via INTRAVENOUS

## 2020-08-05 ENCOUNTER — Other Ambulatory Visit: Payer: BC Managed Care – PPO

## 2020-08-05 DIAGNOSIS — C61 Malignant neoplasm of prostate: Secondary | ICD-10-CM | POA: Diagnosis not present

## 2020-08-12 DIAGNOSIS — C61 Malignant neoplasm of prostate: Secondary | ICD-10-CM | POA: Diagnosis not present

## 2020-09-23 DIAGNOSIS — C61 Malignant neoplasm of prostate: Secondary | ICD-10-CM | POA: Diagnosis not present

## 2020-10-04 DIAGNOSIS — N390 Urinary tract infection, site not specified: Secondary | ICD-10-CM | POA: Diagnosis not present

## 2020-10-04 DIAGNOSIS — N3000 Acute cystitis without hematuria: Secondary | ICD-10-CM | POA: Diagnosis not present

## 2020-10-04 DIAGNOSIS — B952 Enterococcus as the cause of diseases classified elsewhere: Secondary | ICD-10-CM | POA: Diagnosis not present

## 2020-11-04 DIAGNOSIS — N3000 Acute cystitis without hematuria: Secondary | ICD-10-CM | POA: Diagnosis not present

## 2020-11-10 DIAGNOSIS — R8271 Bacteriuria: Secondary | ICD-10-CM | POA: Diagnosis not present

## 2021-03-22 ENCOUNTER — Other Ambulatory Visit (HOSPITAL_COMMUNITY): Payer: Self-pay

## 2021-03-22 MED ORDER — METHYLPREDNISOLONE 4 MG PO TBPK
ORAL_TABLET | ORAL | 0 refills | Status: DC
  Filled 2021-03-22 – 2021-03-31 (×2): qty 21, 6d supply, fill #0

## 2021-03-30 ENCOUNTER — Other Ambulatory Visit (HOSPITAL_COMMUNITY): Payer: Self-pay

## 2021-03-31 ENCOUNTER — Other Ambulatory Visit (HOSPITAL_COMMUNITY): Payer: Self-pay

## 2021-03-31 ENCOUNTER — Ambulatory Visit: Payer: BC Managed Care – PPO | Admitting: Cardiology

## 2021-04-04 ENCOUNTER — Ambulatory Visit: Payer: BC Managed Care – PPO | Admitting: Cardiology

## 2021-04-04 NOTE — Progress Notes (Signed)
Cardiology Office Note:    Date:  04/05/2021   ID:  Stanley Sharp, DOB [birth year], MRN [id number]  PCP:  Stanley School, MD  [facility] Cardiologist:  Stanley Furbish, MD  [facility] Electrophysiologist:  None   Referring MD: Stanley School, MD      History of Present Illness:    Stanley Sharp is a 60 y.o. male here for the follow-up of palpitations PVCs prior tachycardia.  His wife Stanley Sharp works in the heart failure clinic.  At his last visit, he had superficial Vein thrombosis treated with warm compresses.  PVCs have been noted with normal echocardiogram in the past.  Had tried metoprolol and diltiazem.  He was off of both medications and was doing well.  Today, he is doing well. Occasionally, he will still feel palpitations but they have not worsened. The superficial vein thrombosis has resolved since last visit, Overall, he has no major complaints.  He denies any chest pain or shortness of breath. No lightheadedness, headaches, syncope, orthopnea, PND, lower extremity edema or exertional symptoms.  Past Medical History:  Diagnosis Date   Kidney stones    No significant past medical history    Prostate cancer [facility])     Past Surgical History:  Procedure Laterality Date   CERVICAL SPINE SURGERY     COLONOSCOPY N/A 02/11/2020   Procedure: COLONOSCOPY;  Surgeon: Stanley Houston, MD;  Location: [facility]O [facility];  Service: Endoscopy;  Laterality: N/A;  830    Current Medications: Current Meds  Medication Sig   Ascorbic Acid (VITAMIN C) 100 MG tablet Take 500 mg by mouth 3 (three) times daily.    Cholecalciferol (VITAMIN D-3) 125 MCG (5000 UT) TABS Take 10,000 Units by mouth daily.    Menatetrenone (VITAMIN K2) 100 MCG TABS Take 100 mcg by mouth daily.   methylPREDNISolone (MEDROL) 4 MG TBPK tablet Take as directed   Omega-3 1000 MG CAPS Take 1,000 mg by mouth 3 (three) times daily.    Saw Palmetto, Serenoa repens, 450 MG CAPS Take 450 mg by mouth 3 (three) times daily.    Zinc  30 MG TABS Take 30 mg by mouth daily.   [DISCONTINUED] metoprolol succinate (TOPROL-XL) 25 MG 24 hr tablet TAKE 1 TABLET DAILY.     Allergies:   Levofloxacin   Social History   Socioeconomic History   Marital status: Married    Spouse name: Not on file   Number of children: Not on file   Years of education: Not on file   Highest education level: Not on file  Occupational History   Not on file  Tobacco Use   Smoking status: Never   Smokeless tobacco: Never  Vaping Use   Vaping Use: Never used  Substance and Sexual Activity   Alcohol use: No   Drug use: No   Sexual activity: Not on file  Other Topics Concern   Not on file  Social History Narrative   Not on file   Social Determinants of Health   Financial Resource Strain: Not on file  Food Insecurity: Not on file  Transportation Needs: Not on file  Physical Activity: Not on file  Stress: Not on file  Social Connections: Not on file     Family History: The patient's family history includes Diabetes in his mother; Heart Problems in his maternal grandmother; Heart attack in his maternal grandfather; Hyperlipidemia in his father; Stroke in his father.  ROS:   Please see the history of present illness.   (+)  Palpitations  all other systems reviewed and are negative.  EKGs/Labs/Other Studies Reviewed:    The following studies were reviewed today: CT Chest 11/06/19 1. Faint bilateral lower lobe confluent ground-glass densities concerning for developing infiltrate, possibly atypical. Aspiration is not excluded. Clinical correlation and follow-up recommended. 2. Nonobstructing bilateral renal calculi. No hydronephrosis. Several small stones along the posterior bladder wall likely recently passed kidney stones. 3. No bowel obstruction. 4. Enlarged prostate gland. 5. Aortic Atherosclerosis (ICD10-I70.0).  Lower Venous Study 03/31/18 Right: No evidence of common femoral vein obstruction.  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 There is no evidence of deep vein  thrombosis in the lower extremity. No cystic structure found in the  popliteal fossa.   Lower Venous Study 02/14/18 Right: No evidence of common femoral vein obstruction.  Left: Findings consistent with acute superficial vein thrombosis involving  the left small saphenous vein. There is no evidence of deep vein  thrombosis in the lower extremity. No cystic structure found in the  popliteal fossa.   Echo 2012-normal EF  EKG:  EKG was personally reviewed 04/05/21: Sinus rhythm, rate 75 bpm 03/10/20: sinus rhythm 69  Recent Labs: No results found for requested labs within last 8760 hours.  Recent Lipid Panel No results found for: CHOL, TRIG, HDL, CHOLHDL, VLDL, LDLCALC, LDLDIRECT   Risk Assessment/Calculations:       Physical Exam:    VS:  BP 100/60 (BP Location: Left Arm, Patient Position: Sitting, Cuff Size: Normal)   Pulse 75   Ht 6' (1.829 m)   Wt 189 lb (85.7 kg)   SpO2 95%   BMI 25.63 kg/m     Wt Readings from Last 3 Encounters:  04/05/21 189 lb (85.7 kg)  03/10/20 186 lb (84.4 kg)  02/11/20 169 lb (76.7 kg)     GEN:  Well nourished, well developed in no acute distress HEENT: Normal NECK: No JVD; No carotid bruits LYMPHATICS: No lymphadenopathy CARDIAC: RRR, no murmurs, rubs, gallops RESPIRATORY:  Clear to auscultation without rales, wheezing or rhonchi  ABDOMEN: Soft, non-tender, non-distended MUSCULOSKELETAL:  No edema; No deformity  SKIN: Warm and dry NEUROLOGIC:  Alert and oriented x 3 PSYCHIATRIC:  Normal affect   ASSESSMENT:    1. PVC (premature ventricular contraction)   2. Palpitations     PLAN:    PVC (premature ventricular contraction) Continue with Toprol-XL 25 mg a day.  EKG excellent today.  When he previously decreased caffeine palpitations were helped significantly.   Back pain has been resolved since Dr. Ronnald Sharp operated.   Feels much better.   Medication Adjustments/Labs and Tests Ordered: Current medicines are reviewed at length with the patient today.  Concerns regarding medicines are outlined above.  Orders Placed This Encounter  Procedures   EKG 12-Lead    Meds ordered this encounter  Medications   metoprolol succinate (TOPROL-XL) 25 MG 24 hr tablet    Sig: Take 1 tablet (25 mg total) by mouth daily.    Dispense:  90 tablet    Refill:  3     Patient Instructions  Medication Instructions:   Your physician recommends that you continue on your current medications as directed. Please refer to the Current Medication list given to you today.  *If you need a refill on your cardiac medications before your next appointment, please call your pharmacy*   Follow-Up: At [facility], you and your health needs are our priority.  As part of our  continuing mission to provide you with exceptional heart care, we have created designated Provider Care Teams.  These Care Teams include your primary Cardiologist (physician) and Advanced Practice Providers (APPs -  Physician Assistants and Nurse Practitioners) who all work together to provide you with the care you need, when you need it.  We recommend signing up for the patient portal called "MyChart".  Sign up information is provided on this After Visit Summary.  MyChart is used to connect with patients for Virtual Visits (Telemedicine).  Patients are able to view lab/test results, encounter notes, upcoming appointments, etc.  Non-urgent messages can be sent to your provider as well.   To learn more about what you can do with MyChart, go to [email or web address].    Your next appointment:   1 year(s)  The format for your next appointment:   In Person  Provider:   Candee Furbish, MD        Wilhemina Bonito as a scribe for Stanley Furbish, MD.,have documented all relevant documentation on the behalf of Stanley Furbish, MD,as directed by  Stanley Furbish, MD while  in the presence of Stanley Furbish, MD.  I, Stanley Furbish, MD, have reviewed all documentation for this visit. The documentation on 04/05/21 for the exam, diagnosis, procedures, and orders are all accurate and complete.   Signed, Stanley Furbish, MD  04/05/2021 12:25 PM    [facility]

## 2021-04-05 ENCOUNTER — Ambulatory Visit: Payer: No Typology Code available for payment source | Admitting: Cardiology

## 2021-04-05 ENCOUNTER — Other Ambulatory Visit (HOSPITAL_COMMUNITY): Payer: Self-pay

## 2021-04-05 ENCOUNTER — Encounter: Payer: Self-pay | Admitting: Cardiology

## 2021-04-05 ENCOUNTER — Other Ambulatory Visit: Payer: Self-pay

## 2021-04-05 DIAGNOSIS — R002 Palpitations: Secondary | ICD-10-CM

## 2021-04-05 DIAGNOSIS — I493 Ventricular premature depolarization: Secondary | ICD-10-CM | POA: Diagnosis not present

## 2021-04-05 MED ORDER — METOPROLOL SUCCINATE ER 25 MG PO TB24: 25.0000 mg | ORAL_TABLET | Freq: Every day | ORAL | 3 refills | Status: DC

## 2021-04-05 MED ORDER — METOPROLOL SUCCINATE ER 25 MG PO TB24
25.0000 mg | ORAL_TABLET | Freq: Every day | ORAL | 3 refills | Status: DC
  Filled 2021-04-05: qty 90, 90d supply, fill #0
  Filled 2021-08-22: qty 90, 90d supply, fill #1

## 2021-04-05 NOTE — Patient Instructions (Signed)
Medication Instructions:   Your physician recommends that you continue on your current medications as directed. Please refer to the Current Medication list given to you today.  *If you need a refill on your cardiac medications before your next appointment, please call your pharmacy*   Follow-Up: At [facility], you and your health needs are our priority.  As part of our continuing mission to provide you with exceptional heart care, we have created designated Provider Care Teams.  These Care Teams include your primary Cardiologist (physician) and Advanced Practice Providers (APPs -  Physician Assistants and Nurse Practitioners) who all work together to provide you with the care you need, when you need it.  We recommend signing up for the patient portal called "MyChart".  Sign up information is provided on this After Visit Summary.  MyChart is used to connect with patients for Virtual Visits (Telemedicine).  Patients are able to view lab/test results, encounter notes, upcoming appointments, etc.  Non-urgent messages can be sent to your provider as well.   To learn more about what you can do with MyChart, go to [email or web address].    Your next appointment:   1 year(s)  The format for your next appointment:   In Person  Provider:   Candee Furbish, MD

## 2021-04-05 NOTE — Assessment & Plan Note (Signed)
Continue with Toprol-XL 25 mg a day.  EKG excellent today.  When he previously decreased caffeine palpitations were helped significantly.

## 2021-04-05 NOTE — Addendum Note (Signed)
Addended by: Nuala Alpha on: 04/05/2021 12:27 PM   Modules accepted: Orders

## 2021-04-21 ENCOUNTER — Ambulatory Visit: Payer: BC Managed Care – PPO | Admitting: Cardiology

## 2021-08-22 ENCOUNTER — Other Ambulatory Visit (HOSPITAL_COMMUNITY): Payer: Self-pay

## 2021-11-07 DIAGNOSIS — R8271 Bacteriuria: Secondary | ICD-10-CM | POA: Diagnosis not present

## 2021-11-10 ENCOUNTER — Other Ambulatory Visit (HOSPITAL_COMMUNITY): Payer: Self-pay

## 2022-03-27 ENCOUNTER — Encounter: Payer: Self-pay | Admitting: Cardiology

## 2022-03-27 ENCOUNTER — Ambulatory Visit: Payer: 59 | Attending: Cardiology | Admitting: Cardiology

## 2022-03-27 VITALS — BP 110/70 | HR 63 | Ht 72.0 in | Wt 200.0 lb

## 2022-03-27 DIAGNOSIS — R002 Palpitations: Secondary | ICD-10-CM

## 2022-03-27 DIAGNOSIS — I493 Ventricular premature depolarization: Secondary | ICD-10-CM | POA: Diagnosis not present

## 2022-03-27 MED ORDER — METOPROLOL SUCCINATE ER 25 MG PO TB24: 25.0000 mg | ORAL_TABLET | Freq: Every day | ORAL | 3 refills | Status: DC

## 2022-03-27 NOTE — Patient Instructions (Signed)
Medication Instructions:  The current medical regimen is effective;  continue present plan and medications.  *If you need a refill on your cardiac medications before your next appointment, please call your pharmacy*  Follow-Up: At [facility], you and your health needs are our priority.  As part of our continuing mission to provide you with exceptional heart care, we have created designated Provider Care Teams.  These Care Teams include your primary Cardiologist (physician) and Advanced Practice Providers (APPs -  Physician Assistants and Nurse Practitioners) who all work together to provide you with the care you need, when you need it.  We recommend signing up for the patient portal called "MyChart".  Sign up information is provided on this After Visit Summary.  MyChart is used to connect with patients for Virtual Visits (Telemedicine).  Patients are able to view lab/test results, encounter notes, upcoming appointments, etc.  Non-urgent messages can be sent to your provider as well.   To learn more about what you can do with MyChart, go to [email or web address].    Your next appointment:   1 year(s)  The format for your next appointment:   In Person  Provider:   Mark Skains, MD      Important Information About Sugar       

## 2022-03-27 NOTE — Progress Notes (Signed)
Cardiology Office Note:    Date:  03/27/2022   ID:  Stanley Sharp, DOB 04/02/1961, MRN [id number]  PCP:  Redmond School, MD  [facility] Cardiologist:  Candee Furbish, MD  [facility] Electrophysiologist:  None   Referring MD: Redmond School, MD      History of Present Illness:    Stanley Sharp is a 61 y.o. male here for the follow-up of palpitations PVCs prior tachycardia.  His wife Stanley Sharp works in the heart failure clinic.  At his last visit, he had superficial Vein thrombosis treated with warm compresses.  PVCs have been noted with normal echocardiogram in the past.  Had tried metoprolol and diltiazem.  He was off of both medications and was doing well.  During his last visit, he was doing well. Occasionally, he would still feel palpitations but they had not worsened. The superficial vein thrombosis was resolved since prior  visit, Overall, he had no major complaints.  Today, he is accompanied by [facility]. He reports that he doesn't feel any changes since the last visit. He is overall feeling well.  He denies any palpitations, chest pain, shortness of breath, or peripheral edema. No lightheadedness, headaches, syncope, orthopnea, or PND.  Past Medical History:  Diagnosis Date   Kidney stones    No significant past medical history    Prostate cancer [facility])     Past Surgical History:  Procedure Laterality Date   CERVICAL SPINE SURGERY     COLONOSCOPY N/A 02/11/2020   Procedure: COLONOSCOPY;  Surgeon: Rogene Houston, MD;  Location: [facility];  Service: Endoscopy;  Laterality: N/A;  830    Current Medications: No outpatient medications have been marked as taking for the 03/27/22 encounter (Office Visit) with Jerline Pain, MD.     Allergies:   Levofloxacin   Social History   Socioeconomic History   Marital status: Married    Spouse name: Not on file   Number of children: Not on file   Years of education: Not on file   Highest education level: Not on file   Occupational History   Not on file  Tobacco Use   Smoking status: Never   Smokeless tobacco: Never  Vaping Use   Vaping Use: Never used  Substance and Sexual Activity   Alcohol use: No   Drug use: No   Sexual activity: Not on file  Other Topics Concern   Not on file  Social History Narrative   Not on file   Social Determinants of Health   Financial Resource Strain: Not on file  Food Insecurity: Not on file  Transportation Needs: Not on file  Physical Activity: Not on file  Stress: Not on file  Social Connections: Not on file     Family History: The patient's family history includes Diabetes in his mother; Heart Problems in his maternal grandmother; Heart attack in his maternal grandfather; Hyperlipidemia in his father; Stroke in his father.  ROS:   Please see the history of present illness.   all other systems reviewed and are negative.  EKGs/Labs/Other Studies Reviewed:    The following studies were reviewed today: CT Chest 11/06/19 1. Faint bilateral lower lobe confluent ground-glass densities concerning for developing infiltrate, possibly atypical. Aspiration is not excluded. Clinical correlation and follow-up recommended. 2. Nonobstructing bilateral renal calculi. No hydronephrosis. Several small stones along the posterior bladder wall likely recently passed kidney stones. 3. No bowel obstruction. 4. Enlarged prostate gland. 5. Aortic Atherosclerosis (ICD10-I70.0).  Lower Venous Study  03/31/18 Right: No evidence of common femoral vein obstruction.  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 There is no evidence of deep vein  thrombosis in the lower extremity. No cystic structure found in the  popliteal fossa.   Lower Venous Study 02/14/18 Right: No evidence of common femoral vein obstruction.  Left: Findings consistent with acute superficial vein thrombosis involving  the left small  saphenous vein. There is no evidence of deep vein  thrombosis in the lower extremity. No cystic structure found in the  popliteal fossa.   Echo 2012-normal EF  EKG:  EKG was personally reviewed and interpreted.  03/27/2022: Sinus rhythm. Rate 63 bpm with PVCs 04/05/21: Sinus rhythm, rate 75 bpm 03/10/20: Sinus rhythm 69  Recent Labs: No results found for requested labs within last 365 days.  Recent Lipid Panel No results found for: "CHOL", "TRIG", "HDL", "CHOLHDL", "VLDL", "LDLCALC", "LDLDIRECT"   Risk Assessment/Calculations:       Physical Exam:    VS:  BP 110/70 (BP Location: Left Arm, Patient Position: Sitting, Cuff Size: Normal)   Pulse 63   Ht 6' (1.829 m)   Wt 200 lb (90.7 kg)   SpO2 97%   BMI 27.12 kg/m     Wt Readings from Last 3 Encounters:  03/27/22 200 lb (90.7 kg)  04/05/21 189 lb (85.7 kg)  03/10/20 186 lb (84.4 kg)     GEN:  Well nourished, well developed in no acute distress HEENT: Normal NECK: No JVD; No carotid bruits LYMPHATICS: No lymphadenopathy CARDIAC: Occasional ectopy RRR, no murmurs, rubs, gallops RESPIRATORY:  Clear to auscultation without rales, wheezing or rhonchi  ABDOMEN: Soft, non-tender, non-distended MUSCULOSKELETAL:  No edema; No deformity  SKIN: Warm and dry NEUROLOGIC:  Alert and oriented x 3 PSYCHIATRIC:  Normal affect   ASSESSMENT:    1. PVC (premature ventricular contraction)   2. Palpitations      PLAN:    PVCs - Continue with Toprol-XL 25 mg a day.  EKG does show 2 PVCs.  Continue with conservative management strategies as well.  In the past maybe 12 years ago he tried to stop his Toprol and was good for about 12 to 18 months and then the PVCs returned and he desired to be back on.  We will continue.  Back pain improved since Dr. Ronnald Ramp operation.   Back pain has been resolved since Dr. Ronnald Ramp operated.  Feels much better.   Follow up in 1 year  Medication Adjustments/Labs and Tests Ordered: Current medicines  are reviewed at length with the patient today.  Concerns regarding medicines are outlined above.  Orders Placed This Encounter  Procedures   EKG 12-Lead   Meds ordered this encounter  Medications   metoprolol succinate (TOPROL-XL) 25 MG 24 hr tablet    Sig: Take 1 tablet (25 mg total) by mouth daily.    Dispense:  90 tablet    Refill:  3   Patient Instructions  Medication Instructions:  The current medical regimen is effective;  continue present plan and medications.  *If you need a refill on your cardiac medications before your next appointment, please call your pharmacy*   Follow-Up: At [facility], you and your health needs are our priority.  As part of our continuing mission to provide you with exceptional heart care, we have created designated Provider Care Teams.  These Care Teams include your primary Cardiologist (physician) and Advanced Practice Providers (APPs -  Physician  Assistants and Nurse Practitioners) who all work together to provide you with the care you need, when you need it.  We recommend signing up for the patient portal called "MyChart".  Sign up information is provided on this After Visit Summary.  MyChart is used to connect with patients for Virtual Visits (Telemedicine).  Patients are able to view lab/test results, encounter notes, upcoming appointments, etc.  Non-urgent messages can be sent to your provider as well.   To learn more about what you can do with MyChart, go to [email or web address].    Your next appointment:   1 year(s)  The format for your next appointment:   In Person  Provider:   Candee Furbish, MD      Important Information About Sugar          I,Rachel Rivera,acting as a scribe for Candee Furbish, MD.,have documented all relevant documentation on the behalf of Candee Furbish, MD,as directed by  Candee Furbish, MD while in the presence of Candee Furbish, MD.  I, Candee Furbish, MD, have reviewed all documentation for this visit. The  documentation on 03/27/22 for the exam, diagnosis, procedures, and orders are all accurate and complete.   Signed, Candee Furbish, MD  03/27/2022 1:41 PM    E. Lopez

## 2022-04-20 DIAGNOSIS — B954 Other streptococcus as the cause of diseases classified elsewhere: Secondary | ICD-10-CM | POA: Diagnosis not present

## 2022-04-20 DIAGNOSIS — N411 Chronic prostatitis: Secondary | ICD-10-CM | POA: Diagnosis not present

## 2022-04-20 DIAGNOSIS — C61 Malignant neoplasm of prostate: Secondary | ICD-10-CM | POA: Diagnosis not present

## 2022-04-20 DIAGNOSIS — R8271 Bacteriuria: Secondary | ICD-10-CM | POA: Diagnosis not present

## 2022-05-23 DIAGNOSIS — N411 Chronic prostatitis: Secondary | ICD-10-CM | POA: Diagnosis not present

## 2022-05-23 DIAGNOSIS — N390 Urinary tract infection, site not specified: Secondary | ICD-10-CM | POA: Diagnosis not present

## 2022-05-23 DIAGNOSIS — R3121 Asymptomatic microscopic hematuria: Secondary | ICD-10-CM | POA: Diagnosis not present

## 2022-05-23 DIAGNOSIS — C61 Malignant neoplasm of prostate: Secondary | ICD-10-CM | POA: Diagnosis not present

## 2022-05-23 DIAGNOSIS — B9689 Other specified bacterial agents as the cause of diseases classified elsewhere: Secondary | ICD-10-CM | POA: Diagnosis not present

## 2022-06-01 ENCOUNTER — Other Ambulatory Visit: Payer: Self-pay | Admitting: Urology

## 2022-06-01 DIAGNOSIS — R3121 Asymptomatic microscopic hematuria: Secondary | ICD-10-CM

## 2022-06-19 ENCOUNTER — Other Ambulatory Visit: Payer: 59

## 2022-06-19 DIAGNOSIS — N200 Calculus of kidney: Secondary | ICD-10-CM | POA: Diagnosis not present

## 2022-06-19 DIAGNOSIS — R3121 Asymptomatic microscopic hematuria: Secondary | ICD-10-CM | POA: Diagnosis not present

## 2022-06-19 DIAGNOSIS — N3289 Other specified disorders of bladder: Secondary | ICD-10-CM | POA: Diagnosis not present

## 2022-06-27 DIAGNOSIS — N210 Calculus in bladder: Secondary | ICD-10-CM | POA: Diagnosis not present

## 2022-06-27 DIAGNOSIS — R3121 Asymptomatic microscopic hematuria: Secondary | ICD-10-CM | POA: Diagnosis not present

## 2022-06-29 ENCOUNTER — Other Ambulatory Visit: Payer: Self-pay | Admitting: Urology

## 2022-07-02 ENCOUNTER — Encounter (HOSPITAL_COMMUNITY): Payer: Self-pay

## 2022-07-02 NOTE — Patient Instructions (Signed)
SURGICAL WAITING ROOM VISITATION Patients having surgery or a procedure may have no more than 2 support people in the waiting area - these visitors may rotate.    If the patient needs to stay at the hospital during part of their recovery, the visitor guidelines for inpatient rooms apply. Pre-op nurse will coordinate an appropriate time for 1 support person to accompany patient in pre-op.  This support person may not rotate.    Please refer to the [facility] website for the visitor guidelines for Inpatients (after your surgery is over and you are in a regular room).   Due to an increase in RSV and influenza rates and associated hospitalizations, children ages 3 and under may not visit patients in [place].     Your procedure is scheduled on: 07-16-22   Report to [facility] Main Entrance    Report to admitting at 12:15 PM   Call this number if you have problems the morning of surgery [phone]   Do not eat food or drink liquids :After Midnight.          If you have questions, please contact your surgeon's office.   FOLLOW  ANY ADDITIONAL PRE OP INSTRUCTIONS YOU RECEIVED FROM YOUR SURGEON'S OFFICE!!!     Oral Hygiene is also important to reduce your risk of infection.                                    Remember - BRUSH YOUR TEETH THE MORNING OF SURGERY WITH YOUR REGULAR TOOTHPASTE   Do NOT smoke after Midnight   Take these medicines the morning of surgery with A SIP OF WATER:   Doxycycline  Metoprolol  Omeprazole                              You may not have any metal on your body including  jewelry, and body piercing             Do not wear  lotions, powders, cologne, or deodorant              Men may shave face and neck.   Do not bring valuables to the hospital. [place].   Contacts, dentures or bridgework may not be worn into surgery.  DO NOT [place]. PHARMACY WILL  DISPENSE MEDICATIONS LISTED ON YOUR MEDICATION LIST TO YOU DURING YOUR ADMISSION [place]!    Patients discharged on the day of surgery will not be allowed to drive home.  Someone NEEDS to stay with you for the first 24 hours after anesthesia.   Special Instructions: Bring a copy of your healthcare power of attorney and living will documents the day of surgery if you haven't scanned them before.              Please read over the following fact sheets you were given: IF [place] [phone]  If you received a COVID test during your pre-op visit  it is requested that you wear a mask when out in public, stay away from anyone that may not be feeling well and notify your surgeon if you develop symptoms. If you test positive for Covid or have been in contact with anyone that has tested positive in the  last 10 days please notify you surgeon.  [place] - Preparing for Surgery Before surgery, you can play an important role.  Because skin is not sterile, your skin needs to be as free of germs as possible.  You can reduce the number of germs on your skin by washing with CHG (chlorahexidine gluconate) soap before surgery.  CHG is an antiseptic cleaner which kills germs and bonds with the skin to continue killing germs even after washing. Please DO NOT use if you have an allergy to CHG or antibacterial soaps.  If your skin becomes reddened/irritated stop using the CHG and inform your nurse when you arrive at Short Stay. Do not shave (including legs and underarms) for at least 48 hours prior to the first CHG shower.  You may shave your face/neck.  Please follow these instructions carefully:  1.  Shower with CHG Soap the night before surgery and the  morning of surgery.  2.  If you choose to wash your hair, wash your hair first as usual with your normal  shampoo.  3.  After you shampoo, rinse your hair and body thoroughly to remove the shampoo.                              4.  Use CHG as you would any other liquid soap.  You can apply chg directly to the skin and wash.  Gently with a scrungie or clean washcloth.  5.  Apply the CHG Soap to your body ONLY FROM THE NECK DOWN.   Do   not use on face/ open                           Wound or open sores. Avoid contact with eyes, ears mouth and   genitals (private parts).                       Wash face,  Genitals (private parts) with your normal soap.             6.  Wash thoroughly, paying special attention to the area where your    surgery  will be performed.  7.  Thoroughly rinse your body with warm water from the neck down.  8.  DO NOT shower/wash with your normal soap after using and rinsing off the CHG Soap.                9.  Pat yourself dry with a clean towel.            10.  Wear clean pajamas.            11.  Place clean sheets on your bed the night of your first shower and do not  sleep with pets. Day of Surgery : Do not apply any lotions/deodorants the morning of surgery.  Please wear clean clothes to the hospital/surgery center.  FAILURE TO FOLLOW THESE INSTRUCTIONS MAY RESULT IN THE CANCELLATION OF YOUR SURGERY  PATIENT SIGNATURE_________________________________  NURSE SIGNATURE__________________________________  ________________________________________________________________________

## 2022-07-03 ENCOUNTER — Other Ambulatory Visit: Payer: Self-pay

## 2022-07-03 ENCOUNTER — Encounter (HOSPITAL_COMMUNITY): Payer: Self-pay

## 2022-07-03 ENCOUNTER — Other Ambulatory Visit: Payer: 59

## 2022-07-03 ENCOUNTER — Encounter (HOSPITAL_COMMUNITY)
Admission: RE | Admit: 2022-07-03 | Discharge: 2022-07-03 | Disposition: A | Discharge status: Home / Self Care | Payer: 59 | Source: Ambulatory Visit | Attending: Urology | Admitting: Urology

## 2022-07-03 VITALS — BP 121/72 | HR 68 | Temp 98.1°F | Resp 16 | Ht 72.75 in | Wt 198.0 lb

## 2022-07-03 DIAGNOSIS — Z01812 Encounter for preprocedural laboratory examination: Secondary | ICD-10-CM | POA: Insufficient documentation

## 2022-07-03 DIAGNOSIS — N289 Disorder of kidney and ureter, unspecified: Secondary | ICD-10-CM

## 2022-07-03 DIAGNOSIS — Z01818 Encounter for other preprocedural examination: Secondary | ICD-10-CM

## 2022-07-03 HISTORY — DX: Gastro-esophageal reflux disease without esophagitis: K21.9

## 2022-07-03 HISTORY — DX: Cardiac arrhythmia, unspecified: I49.9

## 2022-07-03 HISTORY — DX: Unspecified osteoarthritis, unspecified site: M19.90

## 2022-07-03 HISTORY — DX: Personal history of urinary calculi: Z87.442

## 2022-07-03 LAB — CBC
HCT: 43.7 % (ref 39.0–52.0)
Hemoglobin: 14.3 g/dL (ref 13.0–17.0)
MCH: 30.9 pg (ref 26.0–34.0)
MCHC: 32.7 g/dL (ref 30.0–36.0)
MCV: 94.4 fL (ref 80.0–100.0)
Platelets: 184 10*3/uL (ref 150–400)
RBC: 4.63 MIL/uL (ref 4.22–5.81)
RDW: 13.3 % (ref 11.5–15.5)
WBC: 6.5 10*3/uL (ref 4.0–10.5)
nRBC: 0 % (ref 0.0–0.2)

## 2022-07-03 LAB — BASIC METABOLIC PANEL
Anion gap: 8 (ref 5–15)
BUN: 20 mg/dL (ref 8–23)
CO2: 28 mmol/L (ref 22–32)
Calcium: 9.5 mg/dL (ref 8.9–10.3)
Chloride: 105 mmol/L (ref 98–111)
Creatinine, Ser: 1.13 mg/dL (ref 0.61–1.24)
GFR, Estimated: >60 mL/min (ref >60)
Glucose, Bld: 106 mg/dL — ABNORMAL HIGH (ref 70–99)
Potassium: 4.5 mmol/L (ref 3.5–5.1)
Sodium: 141 mmol/L (ref 135–145)

## 2022-07-03 NOTE — Progress Notes (Signed)
Anesthesia Review:  PCP: Redmond School  Cardiologist : DR Ezra Sites- [place] 03/27/22  Chest x-ray : EKG : 03/27/22  Echo : 2012  Stress test: Cardiac Cath :  Activity level: can do a flight of stairs without difficutly  Sleep Study/ CPAP : none  Fasting Blood Sugar :      / Checks Blood Sugar -- times a day:   Blood Thinner/ Instructions /Last Dose: ASA / Instructions/ Last Dose :    Hx of pvc's

## 2022-07-13 NOTE — H&P (Signed)
Office Visit Report     06/27/2022   --------------------------------------------------------------------------------   Stanley Sharp  MRN: [id number]  DOB: 1960-07-20, 62 year old Male  SSN: [id number]   PRIMARY CARE:  Stanley School, MD  PRIMARY CARE FAX:  [phone]  REFERRING:  Stanley Sharp  PROVIDER:  Raynelle Sharp, M.D.  LOCATION:  [facility] [phone]     --------------------------------------------------------------------------------   CC/HPI: 1. Prostate cancer  2. Chronic prostatitis/persistent bacteriuria   Stanley Sharp returns today for further evaluation. His last urine culture was indeed positive for Enterococcus again indicating persistent bacteriuria. He has been on doxycycline for approximately 5 weeks now. His lower urinary tract symptoms have completely resolved. He follows up today after undergoing a hematuria protocol CT scan for further evaluation of his hematuria as well as to undergo cystoscopy for further evaluation. He was scheduled to have a PSA prior to his appointment today as well although this was not performed and will be performed prior to cystoscopy today.     ALLERGIES: Levaquin - muscle and joint pain    MEDICATIONS: Doxycycline Hyclate 100 mg tablet 1 tablet PO BID  Phenazopyridine Hcl 200 mg tablet 1 tablet PO Q 8 H PRN  Metoprolol Tartrate 25 mg tablet Oral     GU PSH: Cystoscopy Insert Stent, Left - about 2010 ESWL - about 2010, about 2010 Locm 300-'399Mg'$ /Ml Iodine,1Ml - 06/19/2022 Prostate Needle Biopsy - 09/23/2020, 2020, 2017     NON-GU PSH: Back Surgery (Unspecified) - 2019 Neck Spine Fusion - about 2007 Neck Surgery (Unspecified) - about 2015 Surgical Pathology, Gross And Microscopic Examination For Prostate Needle - 09/23/2020, 2020, 2017     GU PMH: Microscopic hematuria - 06/19/2022, - 05/23/2022 (Acute), Recheck urine in 2 weeks. IF hematuria still present will proceed with possible hematuria workup with  CT and possible cysto w/Dr. Alinda Sharp, - 2019 Chronic prostatitis - 05/23/2022, - 04/20/2022, - 10/04/2021, - 04/07/2021 Prostate Cancer - 05/23/2022, - 04/20/2022, - 10/04/2021, - 04/07/2021, - 09/23/2020, - 08/12/2020, - 02/05/2020, Prostate cancer, - 2017 Acute Cystitis/UTI - 11/04/2020, - 10/04/2020 Ureteral calculus - 2019 Flank Pain (Acute), Left, No definitive left ureteral calculus noted but stone could possibly be radiopaque with urine pH 5.5. Will proceed with conservative MET with Tamsulosin, strainer, and pain medication. If pain persist or worsens may need CT urogram. F/U in 2 weeks for OV. If pain still present will proceed with CT. - 2019 Urinary Tract Inf, Unspec site, Bacteriuria with pyuria - 2017      PMH Notes:   1) Prostate cancer: He was noted to have an elevated PSA of 4.16. This prompted a TRUS biopsy of the prostate on 06/24/15 that confirmed Gleason 3+3=6 adenocarcinoma of the prostate with 2 out of 12 biopsy cores positive for malignancy. He does have a paternal family history of prostate cancer with his father having been treated with radiation at age 66. He is quite healthy overall with minimal medical comorbidities. After a discussion regarding treatment, he elected active surveillance management.   TNM stage: cT1c Nx Mx  PSA: 4.16  Gleason score: 3+3=6  Biopsy (06/24/15): 2/12 cores positive -- R lateral mid (25%), R lateral base (10%)  Prostate volume: 45.9 cc  PSAD: 0.09   Baseline urinary function: IPSS is 1.  Baseline erectile function: SHIM score is 20   Surveillance:   Aug 2017: MRI - PI-RADS 3 lesion right mid lateral gland  Oct 2017: MR/US fusion biopsy - 5/28 cores  positive - R mid lateral (30%, 3+3=6), R lateral base (2/2 cores, 5%, < 5%, 3+3=6), MR lesion (2/2 cores, 50%, 10%, 3+3=6), Vol 41.9 cc  Jan 2020: 12 core biopsy - 3/12 cores positive, R mid (60%, 3+3=6), R lateral mid (50%, 3+3=6), R base (5%, 3+3=6), Vol 46.4 cc  Mar 2022: MRI - No suspicious lesions, Vol  43 cc  May 2022: 12 core biopsy - 3/12 cores positive (R mid 20% 3+3=6, R lateral mid (50%, 3+3=6), R lateral base (10%, 3+3=6), Vol [id number] cc (complicated by enterococcus UTI sensitive to levofloxacin)   2) Urolithiasis:   Apr 2019: Presumably passed a left ureteral stone (no stone analysis)   3) UTI: He developed an enterococcus UTI in the May of 2022 after a prostate biopsy. He then developed recurrent symptomatic bacteruria/enterococcus UTI in February 2023 that failed to clear with routine courses of antibiotic therapy.     May 2022: Enterococcus  Feb 2023: Enterococcus  Apr 2023: Enterococcus  May 2023: Enterococcus      NON-GU PMH: Anxiety, Anxiety - 2017 Ventricular premature depolarization, PVC's (premature ventricular contractions) - 2017 Arthritis GERD    FAMILY HISTORY: Diabetes - Runs In Family Strokes - Runs In Family   SOCIAL HISTORY: Marital Status: Married Preferred Language: English; Ethnicity: Not Hispanic Or Latino; Race: White Current Smoking Status: Patient has never smoked.   Tobacco Use Assessment Completed: Used Tobacco in last 30 days? Does not use smokeless tobacco. Has never drank.  Does not use drugs. Does not drink caffeine. Has not had a blood transfusion. Patient's occupation Manufacturing engineer.    REVIEW OF SYSTEMS:    GU Review Male:   Patient denies frequent urination, hard to postpone urination, burning/ pain with urination, get up at night to urinate, leakage of urine, stream starts and stops, trouble starting your streams, and have to strain to urinate .  Gastrointestinal (Upper):   Patient denies nausea and vomiting.  Gastrointestinal (Lower):   Patient denies diarrhea and constipation.  Constitutional:   Patient denies fever, night sweats, weight loss, and fatigue.  Skin:   Patient denies skin rash/ lesion and itching.  Eyes:   Patient denies blurred vision and double vision.  Ears/ Nose/ Throat:   Patient denies sore throat  and sinus problems.  Hematologic/Lymphatic:   Patient denies swollen glands and easy bruising.  Cardiovascular:   Patient denies leg swelling and chest pains.  Respiratory:   Patient denies cough and shortness of breath.  Endocrine:   Patient denies excessive thirst.  Musculoskeletal:   Patient denies back pain and joint pain.  Neurological:   Patient denies headaches and dizziness.  Psychologic:   Patient denies depression and anxiety.   VITAL SIGNS: None   GU PHYSICAL EXAMINATION:    Urethral Meatus: Normal size. No lesion, no wart, no discharge, no polyp. Normal location.   MULTI-SYSTEM PHYSICAL EXAMINATION:    Constitutional: Well-nourished. No physical deformities. Normally developed. Good grooming.  Respiratory: No labored breathing, no use of accessory muscles.   Cardiovascular: Normal temperature, normal extremity pulses, no swelling, no varicosities.  Gastrointestinal: No mass, no tenderness, no rigidity, non obese abdomen. No CVA tenderness.     Complexity of Data:  Records Review:   Previous Patient Records  X-Ray Review: C.T. Abdomen/Pelvis: Reviewed Films.     04/11/22 09/26/21 07/07/21 03/31/21 08/05/20 01/21/20 06/30/19 01/09/19  PSA  Total PSA 16.70 ng/mL 9.55 ng/mL 11.40 ng/mL 12.20 ng/mL 4.49 ng/mL 4.29 ng/mL 5.14 ng/mL 4.22 ng/mL   Notes:  CLINICAL DATA: Microhematuria, history of prostate cancer *  Tracking Code: BO *   EXAM:  CT ABDOMEN AND PELVIS WITHOUT AND WITH CONTRAST   TECHNIQUE:  Multidetector CT imaging of the abdomen and pelvis was performed  following the standard protocol before and following the bolus  administration of intravenous contrast.   RADIATION DOSE REDUCTION: This exam was performed according to the  departmental dose-optimization program which includes automated  exposure control, adjustment of the mA and/or kV according to  patient size and/or use of iterative reconstruction technique.   CONTRAST: 125 mL  Omnipaque 300 iodinated contrast IV   COMPARISON: MR prostate, 07/29/2020, CT abdomen pelvis, 11/06/2019   FINDINGS:  Lower chest: No acute abnormality.   Hepatobiliary: No solid liver abnormality is seen. No gallstones,  gallbladder wall thickening, or biliary dilatation.   Pancreas: Unremarkable. No pancreatic ductal dilatation or  surrounding inflammatory changes.   Spleen: Normal in size without significant abnormality.   Adrenals/Urinary Tract: Adrenal glands are unremarkable. Calculus in  the left renal pelvis measuring 0.9 cm (series 5, image 24). Mucosal  thickening and fat stranding about the left renal pelvis and  proximal ureter. Multiple additional small bilateral nonobstructive  renal calculi and lobulated scarring of the bilateral renal  cortices. Diffuse thickening of the decompressed urinary bladder  wall with multiple calculi within the urinary bladder lumen  measuring up to 0.8 cm (series 5, image 67). No additional urinary  tract filling defect on delayed phase imaging.   Stomach/Bowel: Stomach is within normal limits. Appendix appears  normal. No evidence of bowel wall thickening, distention, or  inflammatory changes.   Vascular/Lymphatic: Aortic atherosclerosis. No enlarged abdominal or  pelvic lymph nodes.   Reproductive: Severe, heterogeneous prostatomegaly.   Other: No abdominal wall hernia or abnormality. No ascites.   Musculoskeletal: No acute or significant osseous findings.   IMPRESSION:  1. Calculus in the left renal pelvis measuring 0.9 cm. Mucosal  thickening and fat stranding about the left renal pelvis and  proximal ureter. No hydronephrosis. Correlate with urinalysis.  2. Multiple additional small bilateral nonobstructive renal calculi  and lobulated scarring of the bilateral renal cortices in keeping  with prior infectious or obstructive insult.  3. Diffuse thickening of the decompressed urinary bladder wall with  multiple calculi  within the urinary bladder lumen measuring up to  0.8 cm.  4. Severe, heterogeneous prostatomegaly.  5. No evidence of lymphadenopathy or metastatic disease in the  abdomen or pelvis.   Aortic Atherosclerosis (ICD10-I70.0).    Electronically Signed  By: Delanna Ahmadi M.D.  On: 06/19/2022 14:42   PROCEDURES:         Flexible Cystoscopy - 52000  Indication: Hematuria and persistent bacteriuria Risks, benefits, and potential complications of the procedure were discussed with the patient including infection, bleeding, voiding discomfort, urinary retention, fever, chills, sepsis, and others. All questions were answered. Informed consent was obtained. Sterile technique and intraurethral analgesia were used.  Meatus:  Normal size. Normal location. Normal condition.  Urethra:  No strictures.  External Sphincter:  Normal.  Verumontanum:  Normal.  Prostate:  Moderate hyperplasia. Non-obstructing.  Bladder Neck:  Non-obstructing.  Ureteral Orifices:  Normal location. Normal size. Normal shape. Effluxed clear urine.  Bladder:  Systematic examination of bladder revealed no bladder tumors. He was noted to have 3 bladder calculi measuring approximately 1 cm or smaller.      Chaperone: SM The procedure was well-tolerated and without complications. Instructions were given to call the office immediately if  questions or problems.         Urinalysis w/Scope Dipstick Dipstick Cont'd Micro  Color: Yellow Bilirubin: Neg mg/dL WBC/hpf: NS (Not Seen)  Appearance: Slightly Cloudy Ketones: Trace mg/dL RBC/hpf: 20 - 40/hpf  Specific Gravity: 1.025 Blood: 3+ ery/uL Bacteria: NS (Not Seen)  pH: <=5.0 Protein: Neg mg/dL Cystals: NS (Not Seen)  Glucose: Neg mg/dL Urobilinogen: 0.2 mg/dL Casts: NS (Not Seen)    Nitrites: Neg Trichomonas: Not Present    Leukocyte Esterase: Neg leu/uL Mucous: Present      Epithelial Cells: NS (Not Seen)      Yeast: NS (Not Seen)      Sperm: Not Present    ASSESSMENT:       ICD-10 Details  1 GU:   Chronic prostatitis - N41.1   2   Microscopic hematuria - R31.21   3   Prostate Cancer - C61   4   Renal calculus - N20.0   5   Bladder Stone - N21.0    PLAN:           Orders Labs PSA with Reflex          Schedule Return Visit/Planned Activity: Other See Visit Notes             Note: Will call with lab results and to schedule surgery.          Document Letter(s):  Created for Patient: Clinical Summary         Notes:   1. Prostate cancer: His PSA was repeated today on appropriate antibiotic therapy. This will help to determine whether his most recent PSA was related to inflammatory changes versus possible progression of his prostate cancer. If this remains significantly elevated, I will consider an MRI of the prostate for further evaluation as we try to determine whether he is developing true progression of his prostate cancer.   2. Persistent bacteriuria/hematuria: We discussed his CT scan findings which demonstrated 9 mm left renal pelvic calculus as well as 3 bladder stones. This was also confirmed on cystoscopy today. After reviewing these findings, I have recommended that he proceed with cystolitholapaxy as well as left ureteroscopic laser lithotripsy and left ureteral stent placement. We have reviewed this procedure in detail including the potential risks, complications, and expected recovery process. He gives informed consent. If his PSA remains significantly elevated, we will plan to have his MRI performed prior to his procedure in order to rule out a prostate abscess.   If he continues to have bacteriuria following treatment of his stones, I will then at least consider an MRI of the prostate at the very least. Otherwise, we will get back to routine surveillance of his low risk prostate cancer.   CC:        Next Appointment:      Next Appointment: 10/24/2022 08:00 AM    Appointment Type: Laboratory Appointment    Location: [facility] [phone]    Provider: Lab LAB    Reason for Visit: 6 month labs- PSA- Dr. Alinda Sharp      * Signed by Stanley Sharp, M.D. on 06/27/22 at 6:31 PM (EST)*     APPENDED NOTES:    His PSA decreased to 8.05. We will hold off on a prostate MRI at this time and proceed with treatment of his bladder and renal calculi.    * Signed by Stanley Sharp, M.D. on 06/28/22 at 2:52 PM (EST*

## 2022-07-16 ENCOUNTER — Encounter (HOSPITAL_COMMUNITY)
Admission: RE | Disposition: A | Discharge status: Home / Self Care | Payer: Self-pay | Source: Ambulatory Visit | Attending: Urology

## 2022-07-16 ENCOUNTER — Ambulatory Visit (HOSPITAL_COMMUNITY): Payer: 59 | Admitting: Physician Assistant

## 2022-07-16 ENCOUNTER — Ambulatory Visit (HOSPITAL_COMMUNITY)
Admission: RE | Admit: 2022-07-16 | Discharge: 2022-07-16 | Disposition: A | Discharge status: Home / Self Care | Payer: 59 | Source: Ambulatory Visit | Attending: Urology | Admitting: Urology

## 2022-07-16 ENCOUNTER — Encounter (HOSPITAL_COMMUNITY): Payer: Self-pay | Admitting: Urology

## 2022-07-16 ENCOUNTER — Ambulatory Visit (HOSPITAL_BASED_OUTPATIENT_CLINIC_OR_DEPARTMENT_OTHER): Payer: 59 | Admitting: Certified Registered Nurse Anesthetist

## 2022-07-16 ENCOUNTER — Ambulatory Visit (HOSPITAL_COMMUNITY): Payer: 59

## 2022-07-16 DIAGNOSIS — N210 Calculus in bladder: Secondary | ICD-10-CM | POA: Diagnosis not present

## 2022-07-16 DIAGNOSIS — C61 Malignant neoplasm of prostate: Secondary | ICD-10-CM | POA: Insufficient documentation

## 2022-07-16 DIAGNOSIS — N400 Enlarged prostate without lower urinary tract symptoms: Secondary | ICD-10-CM | POA: Insufficient documentation

## 2022-07-16 DIAGNOSIS — N411 Chronic prostatitis: Secondary | ICD-10-CM | POA: Diagnosis not present

## 2022-07-16 DIAGNOSIS — N200 Calculus of kidney: Secondary | ICD-10-CM | POA: Diagnosis not present

## 2022-07-16 DIAGNOSIS — B952 Enterococcus as the cause of diseases classified elsewhere: Secondary | ICD-10-CM | POA: Diagnosis not present

## 2022-07-16 DIAGNOSIS — I700 Atherosclerosis of aorta: Secondary | ICD-10-CM | POA: Diagnosis not present

## 2022-07-16 HISTORY — PX: CYSTOSCOPY WITH LITHOLAPAXY: SHX1425

## 2022-07-16 HISTORY — PX: CYSTOSCOPY/URETEROSCOPY/HOLMIUM LASER/STENT PLACEMENT: SHX6546

## 2022-07-16 SURGERY — CYSTOSCOPY, WITH BLADDER CALCULUS LITHOLAPAXY
Anesthesia: General

## 2022-07-16 MED ORDER — PROPOFOL 10 MG/ML IV BOLUS
INTRAVENOUS | Status: DC | PRN
  Administered 2022-07-16: 150 mg via INTRAVENOUS

## 2022-07-16 MED ORDER — ORAL CARE MOUTH RINSE: 15.0000 mL | Freq: Once | OROMUCOSAL | Status: AC

## 2022-07-16 MED ORDER — GENTAMICIN SULFATE 40 MG/ML IJ SOLN
5.0000 mg/kg | INTRAVENOUS | Status: AC
  Administered 2022-07-16: 450 mg via INTRAVENOUS
  Filled 2022-07-16: qty 11.25

## 2022-07-16 MED ORDER — LACTATED RINGERS IV SOLN: INTRAVENOUS | Status: DC

## 2022-07-16 MED ORDER — ONDANSETRON HCL 4 MG/2ML IJ SOLN
INTRAMUSCULAR | Status: DC | PRN
  Administered 2022-07-16: 4 mg via INTRAVENOUS

## 2022-07-16 MED ORDER — CHLORHEXIDINE GLUCONATE 0.12 % MT SOLN
15.0000 mL | Freq: Once | OROMUCOSAL | Status: AC
  Administered 2022-07-16: 15 mL via OROMUCOSAL

## 2022-07-16 MED ORDER — FENTANYL CITRATE (PF) 100 MCG/2ML IJ SOLN
INTRAMUSCULAR | Status: AC
  Filled 2022-07-16: qty 2

## 2022-07-16 MED ORDER — PROPOFOL 10 MG/ML IV BOLUS
INTRAVENOUS | Status: AC
  Filled 2022-07-16: qty 20

## 2022-07-16 MED ORDER — LIDOCAINE 2% (20 MG/ML) 5 ML SYRINGE
INTRAMUSCULAR | Status: DC | PRN
  Administered 2022-07-16: 100 mg via INTRAVENOUS

## 2022-07-16 MED ORDER — MIDAZOLAM HCL 2 MG/2ML IJ SOLN
INTRAMUSCULAR | Status: DC | PRN
  Administered 2022-07-16: 2 mg via INTRAVENOUS

## 2022-07-16 MED ORDER — PHENYLEPHRINE 80 MCG/ML (10ML) SYRINGE FOR IV PUSH (FOR BLOOD PRESSURE SUPPORT)
PREFILLED_SYRINGE | INTRAVENOUS | Status: DC | PRN
  Administered 2022-07-16: 160 ug via INTRAVENOUS

## 2022-07-16 MED ORDER — SODIUM CHLORIDE 0.9 % IR SOLN
Status: DC | PRN
  Administered 2022-07-16 (×2): 3000 mL

## 2022-07-16 MED ORDER — DEXAMETHASONE SODIUM PHOSPHATE 4 MG/ML IJ SOLN
INTRAMUSCULAR | Status: DC | PRN
  Administered 2022-07-16: 5 mg via INTRAVENOUS

## 2022-07-16 MED ORDER — DEXAMETHASONE SODIUM PHOSPHATE 10 MG/ML IJ SOLN
INTRAMUSCULAR | Status: AC
  Filled 2022-07-16: qty 1

## 2022-07-16 MED ORDER — LIDOCAINE HCL (PF) 2 % IJ SOLN
INTRAMUSCULAR | Status: AC
  Filled 2022-07-16: qty 5

## 2022-07-16 MED ORDER — PHENYLEPHRINE 80 MCG/ML (10ML) SYRINGE FOR IV PUSH (FOR BLOOD PRESSURE SUPPORT)
PREFILLED_SYRINGE | INTRAVENOUS | Status: AC
  Filled 2022-07-16: qty 10

## 2022-07-16 MED ORDER — ONDANSETRON HCL 4 MG/2ML IJ SOLN
INTRAMUSCULAR | Status: AC
  Filled 2022-07-16: qty 2

## 2022-07-16 MED ORDER — MIDAZOLAM HCL 2 MG/2ML IJ SOLN
INTRAMUSCULAR | Status: AC
  Filled 2022-07-16: qty 2

## 2022-07-16 MED ORDER — IOHEXOL 300 MG/ML  SOLN
INTRAMUSCULAR | Status: DC | PRN
  Administered 2022-07-16: 50 mL

## 2022-07-16 MED ORDER — ACETAMINOPHEN 500 MG PO TABS
1000.0000 mg | ORAL_TABLET | Freq: Once | ORAL | Status: AC
  Administered 2022-07-16: 1000 mg via ORAL
  Filled 2022-07-16: qty 2

## 2022-07-16 MED ORDER — TRAMADOL HCL 50 MG PO TABS: 50.0000 mg | ORAL_TABLET | Freq: Four times a day (QID) | ORAL | 0 refills | Status: DC | PRN

## 2022-07-16 MED ORDER — FENTANYL CITRATE (PF) 100 MCG/2ML IJ SOLN
INTRAMUSCULAR | Status: DC | PRN
  Administered 2022-07-16: 50 ug via INTRAVENOUS
  Administered 2022-07-16: 100 ug via INTRAVENOUS
  Administered 2022-07-16: 50 ug via INTRAVENOUS

## 2022-07-16 SURGICAL SUPPLY — 26 items
BAG COUNTER SPONGE SURGICOUNT (BAG) IMPLANT
BAG URO CATCHER STRL LF (MISCELLANEOUS) ×2 IMPLANT
BASKET ZERO TIP NITINOL 2.4FR (BASKET) IMPLANT
CATH URETL OPEN END 6FR 70 (CATHETERS) IMPLANT
CLOTH BEACON ORANGE TIMEOUT ST (SAFETY) ×2 IMPLANT
GLOVE SURG LX STRL 7.5 STRW (GLOVE) ×2 IMPLANT
GOWN STRL REUS W/ TWL XL LVL3 (GOWN DISPOSABLE) ×2 IMPLANT
GOWN STRL REUS W/TWL XL LVL3 (GOWN DISPOSABLE) ×2
GUIDEWIRE STR DUAL SENSOR (WIRE) ×2 IMPLANT
GUIDEWIRE ZIPWRE .038 STRAIGHT (WIRE) IMPLANT
IV NS 1000ML (IV SOLUTION) ×2
IV NS 1000ML BAXH (IV SOLUTION) ×2 IMPLANT
KIT TURNOVER KIT A (KITS) IMPLANT
LASER FIB FLEXIVA PULSE ID 365 (Laser) IMPLANT
LASER FIB FLEXIVA PULSE ID 550 (Laser) ×2 IMPLANT
LASER FIB FLEXIVA PULSE ID 910 (Laser) ×2 IMPLANT
MANIFOLD NEPTUNE II (INSTRUMENTS) ×2 IMPLANT
PACK CYSTO (CUSTOM PROCEDURE TRAY) ×2 IMPLANT
SHEATH NAVIGATOR HD 12/14X36 (SHEATH) IMPLANT
STENT URET 6FRX26 CONTOUR (STENTS) IMPLANT
SYR TOOMEY IRRIG 70ML (MISCELLANEOUS)
SYRINGE TOOMEY IRRIG 70ML (MISCELLANEOUS) IMPLANT
TRACTIP FLEXIVA PULS ID 200XHI (Laser) IMPLANT
TRACTIP FLEXIVA PULSE ID 200 (Laser) ×2
TUBING CONNECTING 10 (TUBING) ×2 IMPLANT
TUBING UROLOGY SET (TUBING) ×2 IMPLANT

## 2022-07-16 NOTE — Op Note (Signed)
Preoperative diagnosis: 1.  Bladder calculi (less than 2.5 cm) 2.  Left renal calculi  Postoperative diagnosis: 1.  Bladder calculi (less than 2.5 cm) 2.  Left renal calculi  Procedures: 1.  Cystoscopy 2.  Cystolitholapaxy 3.  Left retrograde pyelography with interpretation 4.  Left ureteroscopy with laser lithotripsy 5.  Left ureteral stent placement (6 x 26 with string)  Surgeon: Pryor Curia MD  Anesthesia: General  Complications: None  EBL: Minimal  Specimens: Bladder calculi  Disposition of specimens: To pathology  Intraoperative findings: Left retrograde pyelography was performed with a 6 French ureteral catheter and Omnipaque contrast.  This revealed a normal caliber ureter without filling defects.  There were noted to be some filling defects within the main renal collecting system as well as within the left lower pole consistent with known stones.  Indication: Mr. Koppel is a 62 year old gentleman who has had persistent Enterococcus bacteriuria status post prostate biopsy.  He underwent further evaluation and was noted to have multiple large bladder calculi and left renal calculi including a 9 mm renal pelvic stone.  He has been on extended course of doxycycline with improvement of his urinary symptoms.  He presents today for definitive treatment of his bladder and left renal calculi.  The potential risks, complications, and the expected recovery process associated with the above procedures were discussed in detail.  Informed consent was obtained.  Description of procedure: The patient was taken the operating room and a general anesthetic was administered.  He was given preoperative antibiotics, placed in the dorsolithotomy position, and prepped and draped in usual sterile fashion.  Next, a preoperative timeout performed.  Cystourethroscopy was then performed with the 22 French cystoscope sheath.  He was noted to have moderate prostatic hyperplasia with large lateral  lobes and an easily friable prostate.  Each of the bladder stones were felt to be able to be removed intact.  A 0 tip nitinol basket was therefore used in each of the stents was able to be removed intact.  Reinspection of the bladder then turned to the left ureteral orifice.  This was cannulated with a 6 French ureteral catheter and Omnipaque contrast was injected.  Findings were as dictated above.  A 0.38 sensor guidewire was then advanced up into the left renal pelvis and the cystoscope was removed.  A 12/14 36 inch ureteral access sheath was then advanced over the wire up into the proximal ureter under fluoroscopic guidance without resistance.  The digital flexible ureteroscope was then advanced up into the renal collecting system which was examined in its entirety.  The main 9 mm renal pelvic stone was easily identified.  There were other smaller stones and multiple other calyces.  A 200 m holmium laser fiber was then used to perform lithotripsy of each of the stones resulting in stone dusting.  Once all stone fragments were less than 2 mm, the ureteroscope was withdrawn.  The sensor guidewire was left in place and the ureteral access sheath was removed.  The wire was then backloaded on the cystoscope and a 6 x 26 double-J ureteral stent was advanced over the wire using Seldinger technique.  It was positioned appropriately up in the renal pelvis as well as within the bladder.  The wire was then removed with a good curl noted in the renal pelvis as well as within the bladder.  A string tether was left in place.  The bladder was emptied.  The patient tolerated the procedure well without complications.  He was able to be awakened and transferred to recovery unit in satisfactory condition.

## 2022-07-16 NOTE — Anesthesia Procedure Notes (Signed)
Procedure Name: LMA Insertion Date/Time: 07/16/2022 1:50 PM  Performed by: Claudia Desanctis, CRNAPre-anesthesia Checklist: Emergency Drugs available, Patient identified, Suction available and Patient being monitored Patient Re-evaluated:Patient Re-evaluated prior to induction Oxygen Delivery Method: Circle system utilized Preoxygenation: Pre-oxygenation with 100% oxygen Induction Type: IV induction Ventilation: Mask ventilation without difficulty LMA: LMA inserted LMA Size: 5.0 Number of attempts: 1 Placement Confirmation: positive ETCO2 and breath sounds checked- equal and bilateral Tube secured with: Tape Dental Injury: Teeth and Oropharynx as per pre-operative assessment

## 2022-07-16 NOTE — Anesthesia Postprocedure Evaluation (Signed)
Anesthesia Post Note  Patient: Stanley Sharp  Procedure(s) Performed: CYSTOSCOPY WITH LITHOLAPAXY CYSTOSCOPY/LEFT URETEROSCOPY/HOLMIUM LASER/LEFT URETERAL STENT PLACEMENT (Left)     Patient location during evaluation: PACU Anesthesia Type: General Level of consciousness: awake and alert Pain management: pain level controlled Vital Signs Assessment: post-procedure vital signs reviewed and stable Respiratory status: spontaneous breathing, nonlabored ventilation and respiratory function stable Cardiovascular status: blood pressure returned to baseline and stable Postop Assessment: no apparent nausea or vomiting Anesthetic complications: no  No notable events documented.  Last Vitals:  Vitals:   07/16/22 1500 07/16/22 1536  BP: 131/73 (!) 142/56  Pulse: 93 72  Resp: 16 12  Temp:    SpO2: 100% 100%    Last Pain:  Vitals:   07/16/22 1536  TempSrc:   PainSc: 0-No pain                 Vanya Carberry,W. EDMOND

## 2022-07-16 NOTE — Anesthesia Preprocedure Evaluation (Addendum)
Anesthesia Evaluation  Patient identified by MRN, date of birth, ID band Patient awake    Reviewed: Allergy & Precautions, H&P , NPO status , Patient's Chart, lab work & pertinent test results  Airway Mallampati: III  TM Distance: >3 FB Neck ROM: Full    Dental no notable dental hx. (+) Teeth Intact, Dental Advisory Given   Pulmonary neg pulmonary ROS   Pulmonary exam normal breath sounds clear to auscultation       Cardiovascular + dysrhythmias  Rhythm:Regular Rate:Normal     Neuro/Psych negative neurological ROS  negative psych ROS   GI/Hepatic Neg liver ROS,GERD  ,,  Endo/Other  negative endocrine ROS    Renal/GU Renal disease  negative genitourinary   Musculoskeletal  (+) Arthritis , Osteoarthritis,    Abdominal   Peds  Hematology negative hematology ROS (+)   Anesthesia Other Findings   Reproductive/Obstetrics negative OB ROS                             Anesthesia Physical Anesthesia Plan  ASA: 2  Anesthesia Plan: General   Post-op Pain Management: Tylenol PO (pre-op)*   Induction: Intravenous  PONV Risk Score and Plan: 3 and Ondansetron, Dexamethasone and Midazolam  Airway Management Planned: LMA  Additional Equipment:   Intra-op Plan:   Post-operative Plan: Extubation in OR  Informed Consent: I have reviewed the patients History and Physical, chart, labs and discussed the procedure including the risks, benefits and alternatives for the proposed anesthesia with the patient or authorized representative who has indicated his/her understanding and acceptance.     Dental advisory given  Plan Discussed with: CRNA  Anesthesia Plan Comments:        Anesthesia Quick Evaluation

## 2022-07-16 NOTE — Discharge Instructions (Addendum)
You may see some blood in the urine and may have some burning with urination for 48-72 hours. You also may notice that you have to urinate more frequently or urgently after your procedure which is normal.  You should call should you develop an inability urinate, fever > 101, persistent nausea and vomiting that prevents you from eating or drinking to stay hydrated.  If you have a stent, you will likely urinate more frequently and urgently until the stent is removed and you may experience some discomfort/pain in the lower abdomen and flank especially when urinating. You may take pain medication prescribed to you if needed for pain. You may also intermittently have blood in the urine until the stent is removed. .You may remove your stent on Friday morning.  Simply pull the string that is taped to your body and the stent will easily come out.  This may be best done in the shower as some urine may come out with the stent.  Usually you will feel relief once the stent is removed, but occasionally patients can develop pain due to residual swelling of the ureter that may temporarily obstruct the kidney.  This can be managed by taking pain medication and it will typically resolve with time.  Please do not hesitate to call if you have pain that is not controlled with your pain medication or does not improved within 24-48 hours.

## 2022-07-16 NOTE — Interval H&P Note (Signed)
History and Physical Interval Note:  07/16/2022 12:12 PM  Stanley Sharp  has presented today for surgery, with the diagnosis of LEFT RENAL CALCULUS, BLADDER CALCULI.  The various methods of treatment have been discussed with the patient and family. After consideration of risks, benefits and other options for treatment, the patient has consented to  Procedure(s) with comments: CYSTOSCOPY WITH LITHOLAPAXY (N/A) - 60 MINUTES NEEDED FOR CASE CYSTOSCOPY/LEFT URETEROSCOPY/HOLMIUM LASER/LEFT URETERAL STENT PLACEMENT (Left) as a surgical intervention.  The patient's history has been reviewed, patient examined, no change in status, stable for surgery.  I have reviewed the patient's chart and labs.  Questions were answered to the patient's satisfaction.     [employer]

## 2022-07-16 NOTE — Transfer of Care (Signed)
Immediate Anesthesia Transfer of Care Note  Patient: Stanley Sharp  Procedure(s) Performed: CYSTOSCOPY WITH LITHOLAPAXY CYSTOSCOPY/LEFT URETEROSCOPY/HOLMIUM LASER/LEFT URETERAL STENT PLACEMENT (Left)  Patient Location: PACU  Anesthesia Type:General  Level of Consciousness: awake and patient cooperative  Airway & Oxygen Therapy: Patient Spontanous Breathing and Patient connected to face mask  Post-op Assessment: Report given to RN and Post -op Vital signs reviewed and stable  Post vital signs: Reviewed and stable  Last Vitals:  Vitals Value Taken Time  BP 131/76 07/16/22 1443  Temp    Pulse 81 07/16/22 1445  Resp 8 07/16/22 1445  SpO2 100 % 07/16/22 1445  Vitals shown include unvalidated device data.  Last Pain:  Vitals:   07/16/22 1220  TempSrc:   PainSc: 0-No pain         Complications: No notable events documented.

## 2022-07-17 ENCOUNTER — Encounter (HOSPITAL_COMMUNITY): Payer: Self-pay | Admitting: Urology

## 2022-07-19 ENCOUNTER — Other Ambulatory Visit (HOSPITAL_COMMUNITY): Payer: Self-pay

## 2022-07-19 DIAGNOSIS — R300 Dysuria: Secondary | ICD-10-CM | POA: Diagnosis not present

## 2022-07-19 DIAGNOSIS — R3914 Feeling of incomplete bladder emptying: Secondary | ICD-10-CM | POA: Diagnosis not present

## 2022-07-19 DIAGNOSIS — R1084 Generalized abdominal pain: Secondary | ICD-10-CM | POA: Diagnosis not present

## 2022-07-19 MED ORDER — KETOROLAC TROMETHAMINE 10 MG PO TABS
10.0000 mg | ORAL_TABLET | Freq: Three times a day (TID) | ORAL | 0 refills | Status: DC | PRN
  Filled 2022-07-19: qty 15, 5d supply, fill #0

## 2022-07-19 MED ORDER — TAMSULOSIN HCL 0.4 MG PO CAPS
0.4000 mg | ORAL_CAPSULE | Freq: Every evening | ORAL | 3 refills | Status: DC
  Filled 2022-07-19: qty 30, 30d supply, fill #0

## 2022-08-07 DIAGNOSIS — R3914 Feeling of incomplete bladder emptying: Secondary | ICD-10-CM | POA: Diagnosis not present

## 2022-08-07 DIAGNOSIS — N210 Calculus in bladder: Secondary | ICD-10-CM | POA: Diagnosis not present

## 2022-08-07 DIAGNOSIS — N401 Enlarged prostate with lower urinary tract symptoms: Secondary | ICD-10-CM | POA: Diagnosis not present

## 2022-08-07 DIAGNOSIS — C61 Malignant neoplasm of prostate: Secondary | ICD-10-CM | POA: Diagnosis not present

## 2022-08-07 DIAGNOSIS — N200 Calculus of kidney: Secondary | ICD-10-CM | POA: Diagnosis not present

## 2022-10-24 DIAGNOSIS — C61 Malignant neoplasm of prostate: Secondary | ICD-10-CM | POA: Diagnosis not present

## 2022-10-31 DIAGNOSIS — N200 Calculus of kidney: Secondary | ICD-10-CM | POA: Diagnosis not present

## 2022-10-31 DIAGNOSIS — N401 Enlarged prostate with lower urinary tract symptoms: Secondary | ICD-10-CM | POA: Diagnosis not present

## 2022-10-31 DIAGNOSIS — R3914 Feeling of incomplete bladder emptying: Secondary | ICD-10-CM | POA: Diagnosis not present

## 2022-10-31 DIAGNOSIS — C61 Malignant neoplasm of prostate: Secondary | ICD-10-CM | POA: Diagnosis not present

## 2023-04-04 ENCOUNTER — Encounter: Payer: Self-pay | Admitting: Cardiology

## 2023-04-04 ENCOUNTER — Ambulatory Visit: Payer: 59 | Attending: Cardiology | Admitting: Cardiology

## 2023-04-04 ENCOUNTER — Other Ambulatory Visit: Payer: Self-pay | Admitting: Cardiology

## 2023-04-04 VITALS — BP 110/64 | HR 73 | Ht 72.0 in | Wt 197.2 lb

## 2023-04-04 DIAGNOSIS — R9431 Abnormal electrocardiogram [ECG] [EKG]: Secondary | ICD-10-CM | POA: Diagnosis not present

## 2023-04-04 DIAGNOSIS — R002 Palpitations: Secondary | ICD-10-CM | POA: Diagnosis not present

## 2023-04-04 DIAGNOSIS — I493 Ventricular premature depolarization: Secondary | ICD-10-CM

## 2023-04-04 MED ORDER — METOPROLOL SUCCINATE ER 25 MG PO TB24: 25.0000 mg | ORAL_TABLET | Freq: Every day | ORAL | 11 refills | Status: DC

## 2023-04-04 MED ORDER — OMEPRAZOLE MAGNESIUM 20 MG PO TBEC: 20.0000 mg | DELAYED_RELEASE_TABLET | Freq: Every day | ORAL | 11 refills | Status: AC | PRN

## 2023-04-04 NOTE — Patient Instructions (Signed)
 Medication Instructions:  The current medical regimen is effective;  continue present plan and medications.  *If you need a refill on your cardiac medications before your next appointment, please call your pharmacy*  Testing/Procedures: Your physician has requested that you have an echocardiogram. Echocardiography is a painless test that uses sound waves to create images of your heart. It provides your doctor with information about the size and shape of your heart and how well your heart's chambers and valves are working. This procedure takes approximately one hour. There are no restrictions for this procedure. Please do NOT wear cologne, perfume, aftershave, or lotions (deodorant is allowed). Please arrive 15 minutes prior to your appointment time.  Please note: We ask at that you not bring children with you during ultrasound (echo/ vascular) testing. Due to room size and safety concerns, children are not allowed in the ultrasound rooms during exams. Our front office staff cannot provide observation of children in our lobby area while testing is being conducted. An adult accompanying a patient to their appointment will only be allowed in the ultrasound room at the discretion of the ultrasound technician under special circumstances. We apologize for any inconvenience.   Follow-Up: At [facility], you and your health needs are our priority.  As part of our continuing mission to provide you with exceptional heart care, we have created designated Provider Care Teams.  These Care Teams include your primary Cardiologist (physician) and Advanced Practice Providers (APPs -  Physician Assistants and Nurse Practitioners) who all work together to provide you with the care you need, when you need it.  We recommend signing up for the patient portal called "MyChart".  Sign up information is provided on this After Visit Summary.  MyChart is used to connect with patients for Virtual Visits (Telemedicine).   Patients are able to view lab/test results, encounter notes, upcoming appointments, etc.  Non-urgent messages can be sent to your provider as well.   To learn more about what you can do with MyChart, go to [email or web address].    Your next appointment:   1 year(s)  Provider:   Donato Schultz, MD

## 2023-04-04 NOTE — Progress Notes (Signed)
Cardiology Office Note:  .   Date:  04/04/2023  ID:  Stanley Sharp, DOB [birth year], MRN [id number] PCP: Elfredia Nevins, MD  [facility] Providers Cardiologist:  Donato Schultz, MD     History of Present Illness: Marland Kitchen   Stanley Sharp is a 62 y.o. male Discussed with the use of AI scribe software  History of Present Illness   A 63 year old patient with a history of premature ventricular contractions (PVCs), tachycardia, and superficial vein thrombosis presented for a follow-up visit. The patient had previously been treated with metoprolol and diltiazem for the PVCs, but is currently off both medications and reports feeling well. The patient's last echocardiogram was in 2012, which showed normal pump function.  The patient had also undergone back surgery and reports improvement post-operation. Despite the presence of PVCs, the patient has not experienced any fainting episodes, shortness of breath, or swelling. The patient has been adhering to a low dose of metoprolol (25mg /day) and reports no adverse effects.  The patient's creatinine level was 1.13, LDL was 108 in 2022, hemoglobin was 14.3, and hemoglobin A1c was 5.0, all of which are within normal ranges. The patient denies any new symptoms or changes in health status.  The patient has a preference for sweets but denies any alcohol or tobacco use. The patient's spouse reports that the patient has had gray hair since his early twenties.           Studies Reviewed: Marland Kitchen   EKG Interpretation Date/Time:  Thursday April 04 2023 11:46:48 EST Ventricular Rate:  77 PR Interval:  166 QRS Duration:  84 QT Interval:  396 QTC Calculation: 448 R Axis:   71  Text Interpretation: Sinus rhythm with frequent Premature ventricular complexes Nonspecific ST abnormality When compared with ECG of 10-Jan-2018 21:14, Premature ventricular complexes are present Confirmed by Donato Schultz ([id number]) on 04/04/2023 11:50:14 AM    Results LABS Creatinine:  1.13 LDL: 108 Hemoglobin: 14.3 Hemoglobin A1c: 5.0  Risk Assessment/Calculations:            Physical Exam:   VS:  BP 110/64   Pulse 73   Ht 6' (1.829 m)   Wt 197 lb 3.2 oz (89.4 kg)   SpO2 97%   BMI 26.75 kg/m    Wt Readings from Last 3 Encounters:  04/04/23 197 lb 3.2 oz (89.4 kg)  07/16/22 196 lb (88.9 kg)  07/03/22 198 lb (89.8 kg)    GEN: Well nourished, well developed in no acute distress NECK: No JVD; No carotid bruits CARDIAC: occasional ECTOPY RRR, no murmurs, no rubs, no gallops RESPIRATORY:  Clear to auscultation without rales, wheezing or rhonchi  ABDOMEN: Soft, non-tender, non-distended EXTREMITIES:  No edema; No deformity   ASSESSMENT AND PLAN: .    Assessment and Plan    Premature Ventricular Contractions (PVCs) PVCs noted with a normal echocardiogram and pump function. Previously tried metoprolol and diltiazem, currently off both and asymptomatic. EKG today showed 1-2 PVCs. No syncope, dyspnea, or edema. Discussed continuing Toprol XL 25 mg daily, which is effective and well-tolerated. Recommended repeating echocardiogram as deductible is met. - Order echocardiogram - Continue Toprol XL 25 mg daily - Follow up in one year  Superficial Vein Thrombosis Previously treated with lung compression. No current issues.  General Health Maintenance Blood pressure well-controlled. Creatinine 1.13, LDL 108 (2022), hemoglobin 14.3, hemoglobin A1c 5.0. Advised to avoid smoking and excessive alcohol. Encouraged to eat fruits and vegetables. - Continue current lifestyle modifications - No smoking or excessive  alcohol consumption - Maintain a balanced diet with fruits and vegetables  Follow-up - Follow up in one year.               Signed, Donato Schultz, MD

## 2023-05-02 ENCOUNTER — Ambulatory Visit (HOSPITAL_COMMUNITY): Payer: 59 | Attending: Internal Medicine

## 2023-05-02 DIAGNOSIS — R002 Palpitations: Secondary | ICD-10-CM | POA: Insufficient documentation

## 2023-05-02 DIAGNOSIS — R9431 Abnormal electrocardiogram [ECG] [EKG]: Secondary | ICD-10-CM | POA: Insufficient documentation

## 2023-05-02 LAB — ECHOCARDIOGRAM COMPLETE: S' Lateral: 4.09 cm

## 2024-01-29 DIAGNOSIS — I4891 Unspecified atrial fibrillation: Secondary | ICD-10-CM | POA: Diagnosis not present

## 2024-01-29 DIAGNOSIS — Z6826 Body mass index (BMI) 26.0-26.9, adult: Secondary | ICD-10-CM | POA: Diagnosis not present

## 2024-01-29 NOTE — Progress Notes (Signed)
 Cardiology Office Note    Patient Name: Stanley Sharp Date of Encounter: 01/29/2024  Primary Care Provider:  Bertell Satterfield, MD Primary Cardiologist:  Oneil Parchment, MD Primary Electrophysiologist: None   Past Medical History    Past Medical History:  Diagnosis Date   Arthritis    Dysrhythmia    pvc   GERD (gastroesophageal reflux disease)    History of kidney stones    Kidney stones    No significant past medical history    Prostate cancer [facility])     History of Present Illness  Stanley Sharp is a 63 y.o. male with a PMH of tachycardia, PVCs, arthritis, prostate CA, aortic atherosclerosis, superficial vein thrombosis who presents today for abnormal EKG.  Stanley Sharp has been followed by Dr. Parchment since 2016 for management of PVCs and tachycardia.  He had a 2D echo in 2012 that showed normal EF and no significant valve abnormalities.  He denied any fainting episodes or new symptoms.  He has had good management of symptoms with Toprol -XL 25 mg.  He was last seen by Dr. Parchment on 04/04/2023 for follow-up.   He completed an updated 2D echo on 05/02/2023 that showed normal EF with no valve abnormalities.   Stanley Sharp presents today with his wife for evaluation of abnormal EKG and chest wall pain. He has been experiencing muscle spasms in the same area and has tried Aleve for five days but prefers not to take medications long-term. He has not yet tried other conservative measures such as ice or heat. He has a history of PVCs managed with metoprolol , which has been effective. He last saw his cardiologist in November and had an echocardiogram showing normal heart function with no structural changes. He reports experiencing palpitations (PVCs) occasionally, but most of the time he does not notice them. He maintains an active lifestyle, walking 20-25 minutes daily without issues. No recent changes in exercise tolerance or new symptoms related to his cardiac history. Patient denies chest pain, palpitations,  dyspnea, PND, orthopnea, nausea, vomiting, dizziness, syncope, edema, weight gain, or early satiety.  Discussed the use of AI scribe software for clinical note transcription with the patient, who gave verbal consent to proceed.  History of Present Illness   Review of Systems  Please see the history of present illness.    All other systems reviewed and are otherwise negative except as noted above.  Physical Exam    Wt Readings from Last 3 Encounters:  04/04/23 197 lb 3.2 oz (89.4 kg)  07/16/22 196 lb (88.9 kg)  07/03/22 198 lb (89.8 kg)   CD:Uyzmz were no vitals filed for this visit.,There is no height or weight on file to calculate BMI. GEN: Well nourished, well developed in no acute distress Neck: No JVD; No carotid bruits Pulmonary: Clear to auscultation without rales, wheezing or rhonchi  Cardiovascular: Normal rate. Regular rhythm. Normal S1. Normal S2.   Murmurs: There is no murmur.  ABDOMEN: Soft, non-tender, non-distended EXTREMITIES:  No edema; No deformity   EKG/LABS/ Recent Cardiac Studies   ECG personally reviewed by me today -sinus rhythm with rate of 65 bpm and no acute changes consistent with previous EKG.  Risk Assessment/Calculations:        Lab Results  Component Value Date   WBC 6.5 07/03/2022   HGB 14.3 07/03/2022   HCT 43.7 07/03/2022   MCV 94.4 07/03/2022   PLT 184 07/03/2022   Lab Results  Component Value Date   CREATININE 1.13 07/03/2022  BUN 20 07/03/2022   NA 141 07/03/2022   K 4.5 07/03/2022   CL 105 07/03/2022   CO2 28 07/03/2022   No results found for: CHOL, HDL, LDLCALC, LDLDIRECT, TRIG, CHOLHDL  No results found for: HGBA1C Assessment & Plan    Assessment & Plan   1.  PVCs: -EKG shows sinus rhythm with occasional PVCs. No atrial fibrillation. Metoprolol  effective. Echocardiogram normal. - Continue metoprolol . - Monitor for irregular heartbeat symptoms. - Follow up with cardiologist as needed.  2.  Aortic  atherosclerosis: -Aortic atherosclerosis with LDL at 108 mg/dL in 7977. Lifestyle modifications recommended.  Consider low-intensity statin if LDL remains elevated. - Recheck cholesterol levels with PCP  3. Atypical Chest Pain: -Pain due to recent lifting. Non-cardiac origin confirmed by EKG. - Use ice and heat therapy alternately. - Continue ibuprofen. - Consider topical Voltaren. - Follow up with primary care if pain persists for possible imaging.  Disposition: Follow-up with Oneil Parchment, MD as scheduled   Signed, Wyn Raddle, Jackee Shove, NP 01/29/2024, 5:41 PM [facility]

## 2024-01-30 ENCOUNTER — Ambulatory Visit: Attending: Nurse Practitioner | Admitting: Nurse Practitioner

## 2024-01-30 ENCOUNTER — Encounter: Payer: Self-pay | Admitting: Nurse Practitioner

## 2024-01-30 VITALS — BP 134/64 | HR 67 | Ht 72.0 in | Wt 194.0 lb

## 2024-01-30 DIAGNOSIS — R002 Palpitations: Secondary | ICD-10-CM | POA: Diagnosis not present

## 2024-01-30 DIAGNOSIS — I493 Ventricular premature depolarization: Secondary | ICD-10-CM

## 2024-01-30 DIAGNOSIS — R0789 Other chest pain: Secondary | ICD-10-CM | POA: Diagnosis not present

## 2024-01-30 DIAGNOSIS — I700 Atherosclerosis of aorta: Secondary | ICD-10-CM | POA: Diagnosis not present

## 2024-01-30 NOTE — Patient Instructions (Signed)
 Medication Instructions:  Your physician recommends that you continue on your current medications as directed. Please refer to the Current Medication list given to you today. *If you need a refill on your cardiac medications before your next appointment, please call your pharmacy*  Lab Work: None ordered If you have labs (blood work) drawn today and your tests are completely normal, you will receive your results only by: MyChart Message (if you have MyChart) OR A paper copy in the mail If you have any lab test that is abnormal or we need to change your treatment, we will call you to review the results.  Testing/Procedures: None ordered  Follow-Up: At [facility], you and your health needs are our priority.  As part of our continuing mission to provide you with exceptional heart care, our providers are all part of one team.  This team includes your primary Cardiologist (physician) and Advanced Practice Providers or APPs (Physician Assistants and Nurse Practitioners) who all work together to provide you with the care you need, when you need it.  Your next appointment:   2-3 month(s)  Provider:   Oneil Parchment, MD    We recommend signing up for the patient portal called MyChart.  Sign up information is provided on this After Visit Summary.  MyChart is used to connect with patients for Virtual Visits (Telemedicine).  Patients are able to view lab/test results, encounter notes, upcoming appointments, etc.  Non-urgent messages can be sent to your provider as well.   To learn more about what you can do with MyChart, go to [email or web address].   Other Instructions CONTACT YOUR PCP IF THE PAIN DOES NOT GET BETTER

## 2024-02-21 DIAGNOSIS — C61 Malignant neoplasm of prostate: Secondary | ICD-10-CM | POA: Diagnosis not present

## 2024-03-02 DIAGNOSIS — R399 Unspecified symptoms and signs involving the genitourinary system: Secondary | ICD-10-CM | POA: Diagnosis not present

## 2024-03-02 DIAGNOSIS — R8281 Pyuria: Secondary | ICD-10-CM | POA: Diagnosis not present

## 2024-03-02 DIAGNOSIS — C61 Malignant neoplasm of prostate: Secondary | ICD-10-CM | POA: Diagnosis not present

## 2024-04-06 ENCOUNTER — Other Ambulatory Visit: Payer: Self-pay

## 2024-04-06 DIAGNOSIS — R002 Palpitations: Secondary | ICD-10-CM

## 2024-04-08 DIAGNOSIS — N4231 Prostatic intraepithelial neoplasia: Secondary | ICD-10-CM | POA: Diagnosis not present

## 2024-04-08 DIAGNOSIS — C61 Malignant neoplasm of prostate: Secondary | ICD-10-CM | POA: Diagnosis not present

## 2024-04-09 ENCOUNTER — Ambulatory Visit: Admitting: Cardiology

## 2024-04-09 MED ORDER — METOPROLOL SUCCINATE ER 25 MG PO TB24: 25.0000 mg | ORAL_TABLET | Freq: Every day | ORAL | 3 refills | Status: AC

## 2024-04-28 DIAGNOSIS — C61 Malignant neoplasm of prostate: Secondary | ICD-10-CM | POA: Diagnosis not present

## 2024-05-29 ENCOUNTER — Ambulatory Visit: Attending: Cardiology | Admitting: Cardiology

## 2024-05-29 ENCOUNTER — Encounter: Payer: Self-pay | Admitting: Cardiology

## 2024-05-29 ENCOUNTER — Other Ambulatory Visit: Payer: Self-pay | Admitting: *Deleted

## 2024-05-29 VITALS — BP 124/76 | HR 70 | Ht 72.0 in | Wt 199.8 lb

## 2024-05-29 DIAGNOSIS — I2510 Atherosclerotic heart disease of native coronary artery without angina pectoris: Secondary | ICD-10-CM

## 2024-05-29 DIAGNOSIS — Z79899 Other long term (current) drug therapy: Secondary | ICD-10-CM

## 2024-05-29 DIAGNOSIS — R072 Precordial pain: Secondary | ICD-10-CM

## 2024-05-29 DIAGNOSIS — I493 Ventricular premature depolarization: Secondary | ICD-10-CM | POA: Diagnosis not present

## 2024-05-29 DIAGNOSIS — I700 Atherosclerosis of aorta: Secondary | ICD-10-CM

## 2024-05-29 MED ORDER — ROSUVASTATIN CALCIUM 10 MG PO TABS: 10.0000 mg | ORAL_TABLET | Freq: Every day | ORAL | 3 refills | Status: AC

## 2024-05-29 MED ORDER — ASPIRIN 81 MG PO TBEC: 81.0000 mg | DELAYED_RELEASE_TABLET | Freq: Every day | ORAL | Status: AC

## 2024-05-29 NOTE — Patient Instructions (Signed)
 Medication Instructions:  Please start Aspirin  81 mg a day and Crestor  10 mg once a day. Continue all other medications as listed.  *If you need a refill on your cardiac medications before your next appointment, please call your pharmacy*  Lab Work: Please have blood work in 3 months at your closest [facility] ([facility]) If you have labs (blood work) drawn today and your tests are completely normal, you will receive your results only by: MyChart Message (if you have MyChart) OR A paper copy in the mail If you have any lab test that is abnormal or we need to change your treatment, we will call you to review the results.  Testing/Procedures:   Your cardiac CT will be scheduled at:   [facility] [address]  [place], [place] [address]  Please enter the parking lot using the [address] entrance and use the FREE valet service at the patient drop-off area. Enter the building and check-in with registration on the main floor.  Please follow these instructions carefully (unless otherwise directed):  An IV will be required for this test and Nitroglycerin will be given.  Hold all erectile dysfunction medications at least 3 days (72 hrs) prior to test. (Ie viagra, cialis, sildenafil, tadalafil, etc)   On the Night Before the Test: Be sure to Drink plenty of water . Do not consume any caffeinated/decaffeinated beverages or chocolate 12 hours prior to your test. Do not take any antihistamines 12 hours prior to your test.  On the Day of the Test: Drink plenty of water  until 1 hour prior to the test. Do not eat any food 1 hour prior to test. You may take your regular medications prior to the test.  Take metoprolol  (Lopressor ) two hours prior to test. If you take Furosemide/Hydrochlorothiazide/Spironolactone/Chlorthalidone, please HOLD on the morning of the test. Patients who wear a continuous glucose monitor MUST remove the device prior to scanning.       After the Test: Drink plenty of water . After receiving IV contrast, you may experience a mild flushed feeling. This is normal. On occasion, you may experience a mild rash up to 24 hours after the test. This is not dangerous. If this occurs, you can take Benadryl 25 mg, Zyrtec, Claritin, or Allegra and increase your fluid intake. (Patients taking Tikosyn should avoid Benadryl, and may take Zyrtec, Claritin, or Allegra) If you experience trouble breathing, this can be serious. If it is severe call 911 IMMEDIATELY. If it is mild, please call our office.  We will call to schedule your test 2-4 weeks out understanding that some insurance companies will need an authorization prior to the service being performed.   For more information and frequently asked questions, please visit our website : [email or web address]  For non-scheduling related questions, please contact the cardiac imaging nurse navigator should you have any questions/concerns: Cardiac Imaging Nurse Navigators Direct Office Dial: [phone]   For scheduling needs, including cancellations and rescheduling, please call Brittany, [phone].   Follow-Up: At [facility], you and your health needs are our priority.  As part of our continuing mission to provide you with exceptional heart care, our providers are all part of one team.  This team includes your primary Cardiologist (physician) and Advanced Practice Providers or APPs (Physician Assistants and Nurse Practitioners) who all work together to provide you with the care you need, when you need it.  Your next appointment:   1 year(s)  Provider:   Oneil Parchment, MD  We recommend signing up for the patient portal called MyChart.  Sign up information is provided on this After Visit Summary.  MyChart is used to connect with patients for Virtual Visits (Telemedicine).  Patients are able to view lab/test results, encounter notes, upcoming appointments, etc.   Non-urgent messages can be sent to your provider as well.   To learn more about what you can do with MyChart, go to [email or web address].

## 2024-05-29 NOTE — Progress Notes (Signed)
 " Cardiology Office Note:  .   Date:  05/29/2024  ID:  Stanley Sharp, DOB [birth year], MRN [id number] PCP: Bertell Satterfield, MD  [facility] Providers Cardiologist:  Oneil Parchment, MD     History of Present Illness: Stanley Sharp   Stanley Sharp is a 64 y.o. male Discussed the use of AI scribe  Wife Stanley present History of Present Illness DONNIVAN Sharp is a 64 year old male with PVCs and tachycardia who presents for follow-up.  He experiences occasional premature ventricular contractions (PVCs) without significant change in frequency or severity. No fainting spells, unusual symptoms, or recent chest discomfort. He recalls a previous episode of chest spasm after lifting, but has not experienced similar symptoms since.  He has a history of atherosclerosis of the aorta, with a prior LDL of 108 mg/dL in 7977. A CT scan of the chest from 2021 revealed coronary artery calcification. He is currently on Toprol  XL 25 mg daily.  There is a family history of heart disease, with his brother recently undergoing triple bypass surgery and his grandfather having died from heart issues at 78 or 66 years old.  He is considering prostate surgery for prostate cancer, which has been stable for nine years but recently showed a slight increase in Gleason score from six to seven. He has experienced unintentional weight loss in the past (2021 CT scan of chest reviewed, this is where coronary calcification was seen) but has since regained the weight.      ROS: No syncope no bleeding  Studies Reviewed: .        Results Labs Creatinine (07/03/2022): 1.1 Potassium (07/03/2022): 4.5 Hemoglobin (07/03/2022): 14.3 Platelets (07/03/2022): 184 LDL (2022): 108  Radiology Chest CT (2021): Coronary artery calcification and atherosclerotic plaque, subtle calcification in right coronary artery, no significant stenosis (Independently interpreted)  Diagnostic Echocardiogram (05/02/2023): Normal left ventricular systolic function,  normal valvular function EKG (01/2024): Normal sinus rhythm with premature ventricular complexes, heart rate 103 EKG (2019): Normal sinus rhythm with premature ventricular complexes EKG (2011): Normal sinus rhythm with premature ventricular complexes Risk Assessment/Calculations:            Physical Exam:   VS:  BP 124/76   Pulse 70   Ht 6' (1.829 m)   Wt 199 lb 12.8 oz (90.6 kg)   SpO2 98%   BMI 27.10 kg/m    Wt Readings from Last 3 Encounters:  05/29/24 199 lb 12.8 oz (90.6 kg)  01/30/24 194 lb (88 kg)  04/04/23 197 lb 3.2 oz (89.4 kg)    GEN: Well nourished, well developed in no acute distress NECK: No JVD; No carotid bruits CARDIAC: RRR, no murmurs, no rubs, no gallops RESPIRATORY:  Clear to auscultation without rales, wheezing or rhonchi  ABDOMEN: Soft, non-tender, non-distended EXTREMITIES:  No edema; No deformity   ASSESSMENT AND PLAN: .    Assessment and Plan Assessment & Plan Premature Ventricular Contractions PVCs remain unchanged with no increase in frequency or severity. No associated symptoms such as fainting spells or chest discomfort. Previous episode of atrial chest pain due to lifting resolved without recurrence. - Continue Toprol  XL 25 mg oral daily.  Atherosclerotic heart disease with coronary artery calcification, precordial chest pain  Coronary artery calcification noted on CT scan from 2021. No current symptoms of chest pain or discomfort, however did have some chest discomfort noted at prior visit and brother just went through bypass surgery.  Grandfather also had severe coronary disease and died in his 33s.  Family history of heart disease. LDL was 108 in 2022, above the target of less than 70. Discussed the role of Crestor  in stabilizing soft plaque and the importance of further evaluation to assess the extent of calcification and potential blockages. Discussed the benefits of statins in stabilizing plaque and reducing LDL levels. Explained the potential  side effects of statins and the availability of alternatives like Repatha if statins are not tolerated. Emphasized the importance of lifestyle modifications including a Mediterranean diet and regular exercise.  He and his wife are concerned about using statins. - Ordered coronary CT scan to assess calcification and potential stenosis. - Prescribed Crestor  10 mg daily to lower LDL levels. - Recommended baby aspirin  81 mg daily. - Scheduled lipid panel in 3 months to assess LDL levels.  LDL goal less than 70 - Discussed Mediterranean diet and lifestyle modifications.  Prostate cancer - Awaiting prostatectomy.  We will pursue cardiac evaluation first.       Dispo: 1 yr, will follow-up with results of study  Signed, Oneil Parchment, MD  "

## 2024-06-10 ENCOUNTER — Encounter (HOSPITAL_COMMUNITY): Payer: Self-pay

## 2024-06-11 ENCOUNTER — Ambulatory Visit (HOSPITAL_COMMUNITY)
Admission: RE | Admit: 2024-06-11 | Discharge: 2024-06-11 | Disposition: A | Discharge status: Home / Self Care | Source: Ambulatory Visit | Attending: Cardiology | Admitting: Cardiology

## 2024-06-11 DIAGNOSIS — R072 Precordial pain: Secondary | ICD-10-CM | POA: Insufficient documentation

## 2024-06-11 MED ORDER — IOHEXOL 350 MG/ML SOLN
100.0000 mL | Freq: Once | INTRAVENOUS | Status: AC | PRN
  Administered 2024-06-11: 100 mL via INTRAVENOUS

## 2024-06-11 MED ORDER — METOPROLOL TARTRATE 5 MG/5ML IV SOLN
10.0000 mg | INTRAVENOUS | Status: DC | PRN
  Administered 2024-06-11: 5 mg via INTRAVENOUS

## 2024-06-11 MED ORDER — NITROGLYCERIN 0.4 MG SL SUBL
0.8000 mg | SUBLINGUAL_TABLET | Freq: Once | SUBLINGUAL | Status: AC
  Administered 2024-06-11: 0.8 mg via SUBLINGUAL

## 2024-06-11 MED ORDER — DILTIAZEM HCL 25 MG/5ML IV SOLN: 10.0000 mg | INTRAVENOUS | Status: DC | PRN

## 2024-06-13 ENCOUNTER — Ambulatory Visit: Payer: Self-pay | Admitting: Cardiology

## 2024-06-13 DIAGNOSIS — I2510 Atherosclerotic heart disease of native coronary artery without angina pectoris: Secondary | ICD-10-CM

## 2024-06-15 NOTE — Addendum Note (Signed)
 Addended by: JEFFRIE ONEIL BROCKS on: 06/15/2024 07:36 AM   Modules accepted: Orders

## 2024-06-16 ENCOUNTER — Other Ambulatory Visit: Payer: Self-pay

## 2024-06-16 DIAGNOSIS — I2510 Atherosclerotic heart disease of native coronary artery without angina pectoris: Secondary | ICD-10-CM

## 2024-06-23 LAB — LIPID PANEL
Chol/HDL Ratio: 4.2 ratio (ref 0.0–5.0)
Cholesterol, Total: 216 mg/dL — ABNORMAL HIGH (ref 100–199)
HDL: 51 mg/dL
LDL Chol Calc (NIH): 150 mg/dL — ABNORMAL HIGH (ref 0–99)
Triglycerides: 86 mg/dL (ref 0–149)
VLDL Cholesterol Cal: 15 mg/dL (ref 5–40)

## 2024-06-25 ENCOUNTER — Ambulatory Visit: Payer: Self-pay | Admitting: Cardiology

## 2024-06-25 DIAGNOSIS — E785 Hyperlipidemia, unspecified: Secondary | ICD-10-CM

## 2024-06-25 DIAGNOSIS — Z79899 Other long term (current) drug therapy: Secondary | ICD-10-CM

## 2024-06-25 NOTE — Telephone Encounter (Signed)
 Order placed for [facility] referral.  Message sent to pt he will be contacted to be scheduled.
